# Patient Record
Sex: Male | Born: 1970 | Race: White | Hispanic: No | Marital: Single | Smoking: Never smoker
Health system: Southern US, Community
[De-identification: ages and names within clinical notes are randomized; demographics above are authoritative.]

## PROBLEM LIST (undated history)

## (undated) DIAGNOSIS — Z8619 Personal history of other infectious and parasitic diseases: Secondary | ICD-10-CM

## (undated) DIAGNOSIS — K219 Gastro-esophageal reflux disease without esophagitis: Secondary | ICD-10-CM

## (undated) DIAGNOSIS — F3181 Bipolar II disorder: Secondary | ICD-10-CM

## (undated) DIAGNOSIS — G43909 Migraine, unspecified, not intractable, without status migrainosus: Secondary | ICD-10-CM

## (undated) DIAGNOSIS — I499 Cardiac arrhythmia, unspecified: Secondary | ICD-10-CM

## (undated) DIAGNOSIS — F32A Depression, unspecified: Secondary | ICD-10-CM

## (undated) DIAGNOSIS — F419 Anxiety disorder, unspecified: Secondary | ICD-10-CM

## (undated) DIAGNOSIS — F329 Major depressive disorder, single episode, unspecified: Secondary | ICD-10-CM

## (undated) HISTORY — DX: Gastro-esophageal reflux disease without esophagitis: K21.9

## (undated) HISTORY — DX: Personal history of other infectious and parasitic diseases: Z86.19

## (undated) HISTORY — DX: Anxiety disorder, unspecified: F41.9

## (undated) HISTORY — DX: Migraine, unspecified, not intractable, without status migrainosus: G43.909

## (undated) HISTORY — DX: Major depressive disorder, single episode, unspecified: F32.9

## (undated) HISTORY — DX: Bipolar II disorder: F31.81

## (undated) HISTORY — DX: Depression, unspecified: F32.A

---

## 2014-10-14 DIAGNOSIS — Z9884 Bariatric surgery status: Secondary | ICD-10-CM | POA: Insufficient documentation

## 2014-10-14 HISTORY — PX: BARIATRIC SURGERY: SHX1103

## 2016-10-14 HISTORY — PX: SHOULDER ARTHROSCOPY W/ ROTATOR CUFF REPAIR: SHX2400

## 2018-08-01 ENCOUNTER — Ambulatory Visit: Payer: Self-pay | Admitting: Family Medicine

## 2018-11-10 DIAGNOSIS — G43909 Migraine, unspecified, not intractable, without status migrainosus: Secondary | ICD-10-CM

## 2018-11-10 HISTORY — DX: Migraine, unspecified, not intractable, without status migrainosus: G43.909

## 2018-11-18 ENCOUNTER — Emergency Department (HOSPITAL_COMMUNITY): Payer: No Typology Code available for payment source

## 2018-11-18 ENCOUNTER — Emergency Department (HOSPITAL_COMMUNITY)
Admission: EM | Admit: 2018-11-18 | Discharge: 2018-11-19 | Disposition: A | Discharge status: Home / Self Care | Payer: No Typology Code available for payment source | Attending: Emergency Medicine | Admitting: Emergency Medicine

## 2018-11-18 ENCOUNTER — Other Ambulatory Visit: Payer: Self-pay

## 2018-11-18 ENCOUNTER — Encounter (HOSPITAL_COMMUNITY): Payer: Self-pay

## 2018-11-18 DIAGNOSIS — Z79899 Other long term (current) drug therapy: Secondary | ICD-10-CM | POA: Diagnosis not present

## 2018-11-18 DIAGNOSIS — R42 Dizziness and giddiness: Secondary | ICD-10-CM | POA: Diagnosis not present

## 2018-11-18 DIAGNOSIS — Y939 Activity, unspecified: Secondary | ICD-10-CM | POA: Insufficient documentation

## 2018-11-18 DIAGNOSIS — Y999 Unspecified external cause status: Secondary | ICD-10-CM | POA: Insufficient documentation

## 2018-11-18 DIAGNOSIS — Y92410 Unspecified street and highway as the place of occurrence of the external cause: Secondary | ICD-10-CM | POA: Diagnosis not present

## 2018-11-18 DIAGNOSIS — S0990XA Unspecified injury of head, initial encounter: Secondary | ICD-10-CM
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
## 2018-11-19 MED ORDER — HYDROXYZINE HCL 25 MG PO TABS: 25.0000 mg | ORAL_TABLET | Freq: Four times a day (QID) | ORAL | 0 refills | Status: DC | PRN
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
## 2018-11-22 ENCOUNTER — Ambulatory Visit (INDEPENDENT_AMBULATORY_CARE_PROVIDER_SITE_OTHER): Payer: No Typology Code available for payment source | Admitting: Physician Assistant

## 2018-11-22 ENCOUNTER — Encounter: Payer: Self-pay | Admitting: Physician Assistant

## 2018-11-22 ENCOUNTER — Telehealth: Payer: Self-pay | Admitting: Physician Assistant

## 2018-11-22 VITALS — BP 130/84 | HR 86 | Temp 98.5°F | Ht 69.5 in | Wt 197.5 lb

## 2018-11-22 DIAGNOSIS — F321 Major depressive disorder, single episode, moderate: Secondary | ICD-10-CM | POA: Insufficient documentation

## 2018-11-22 DIAGNOSIS — B009 Herpesviral infection, unspecified: Secondary | ICD-10-CM | POA: Insufficient documentation

## 2018-11-22 DIAGNOSIS — F3181 Bipolar II disorder: Secondary | ICD-10-CM | POA: Insufficient documentation

## 2018-11-22 DIAGNOSIS — Z9884 Bariatric surgery status: Secondary | ICD-10-CM | POA: Insufficient documentation

## 2018-11-22 DIAGNOSIS — F419 Anxiety disorder, unspecified: Secondary | ICD-10-CM | POA: Diagnosis not present

## 2018-11-22 DIAGNOSIS — S060X0D Concussion without loss of consciousness, subsequent encounter: Secondary | ICD-10-CM

## 2018-11-22 MED ORDER — HYDROXYZINE HCL 50 MG PO TABS: 50.0000 mg | ORAL_TABLET | Freq: Three times a day (TID) | ORAL | 0 refills | Status: DC | PRN

## 2018-11-22 MED ORDER — ESOMEPRAZOLE MAGNESIUM 40 MG PO CPDR: 40.0000 mg | DELAYED_RELEASE_CAPSULE | Freq: Every day | ORAL | 1 refills | Status: DC

## 2018-11-22 MED ORDER — VALACYCLOVIR HCL 1 G PO TABS: ORAL_TABLET | ORAL | 2 refills | Status: DC

## 2018-11-22 NOTE — Patient Instructions (Signed)
It was great to meet you!  Here's the psychiatrist I recommend, if not in network, refer to the list. [facility] [address], [address] [place], [place] [address] Phone: [phone] Fax: [phone]  Please call the Concussion Clinic at [facility]: [phone]   Concussion, Adult A concussion is a brain injury from a direct hit (blow) to the head or body. This injury causes the brain to shake quickly back and forth inside the skull. It is caused by:  A hit to the head.  A quick and sudden movement (jolt) of the head or neck. How fast you will get better from a concussion depends on many things. Recovery can take time. It is important to wait to return to activity until a doctor says it is safe and your symptoms are all gone. Follow these instructions at home: Activity  Limit activities that need a lot of thought or concentration. You may need to talk with your work Production designer, theatre/television/film or teachers about this. Limit activities such as: ? Homework or work for your job. ? Watching TV. ? Computer work. ? Playing memory games and puzzles.  Rest. Rest helps the brain to heal. Make sure you: ? Get plenty of sleep at night. Do not stay up late. ? Rest during the day. Take naps or rest breaks when you feel tired.  Do not do activities that could cause a second concussion, such as riding a bike or playing sports. It can be dangerous if you get another concussion before the first one has healed.  Ask your doctor when you can return to your normal activities, like driving, riding a bike, or using machinery. Your ability to react may be slower. Do not do these activities if you are dizzy. Your doctor will likely give you a plan for slowly going back to activities. General instructions  Take over-the-counter and prescription medicines only as told by your doctor.  Do not drink alcohol until your doctor says you can.  Watch your symptoms and tell other people to do the same. Other  problems (complications) can happen after a concussion. Older adults with a brain injury may have a higher risk of serious problems, such as a blood clot in the brain.  Tell your work Production designer, theatre/television/film, teachers, Tax adviser, school counselor, coach, or Event organiser about your injury and symptoms. Tell them about what you can or cannot do. They should watch you for: ? More problems with attention or concentration. ? More trouble remembering or learning new information. ? More time needed to do tasks or assignments. ? Being more annoyed (irritable) or having a harder time dealing with stress. ? Any other symptoms that get worse.  Keep all follow-up visits as told by your doctor. This is important. Prevention  It is very important that you donot get another brain injury, especially before you have healed. In rare cases, another injury can cause permanent brain damage, brain swelling, or death. You have the most risk if you get another head injury in the first 7-10 days after you were hurt before. To avoid injuries: ? Avoid activities that could make you get a second concussion, like contact sports. ? When you have returned to sports or activities:  Avoid plays or moves that can cause you to crash into another person. This is how most concussions happen.  Follow the rules and be respectful of other players. ? Get regular exercise that includes strength and balance training. ? Wear a helmet when you do activities like:  Biking.  Skiing.  Skateboarding.  Skating. ? Helmets can help protect you from serious skull and brain injuries, but they do not protect your from a concussion. Even when wearing a helmet, you should avoid being hit in the head. Contact a doctor if:  Your symptoms get worse or they do not get better.  You have new symptoms.  You have another injury. Get help right away if:  You have bad headaches or your headaches get worse.  You have weakness in any part of your  body.  You are confused.  Your coordination gets worse.  You keep throwing up (vomiting).  You feel more sleepy than normal.  You twitch or shake violently (convulse) or have a seizure.  Your speech is not clear (is slurred).  You have strange behavior changes.  You have changes in how you see (vision).  You pass out (lose consciousness). Summary  A concussion is a brain injury from a direct hit (blow) to the head or body.  This condition is treated with rest and careful watching of symptoms.  If you keep having symptoms, call your doctor. This information is not intended to replace advice given to you by your health care provider. Make sure you discuss any questions you have with your health care provider. Document Released: 10/11/2009 Document Revised: 12/04/2017 Document Reviewed: 12/04/2017 Elsevier Interactive Patient Education  2019 [employer].
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
## 2018-11-22 NOTE — Telephone Encounter (Signed)
Spoke to pt, told him I saw he called with update on psychiatrist. Pt said yes, he is going back to [place] next week and seeing a different provider. Told pt okay good. Asked pt if he got in touch with the Concussion clinic? Pt said no but left message. Told pt if you do not hear from them first thing Monday AM to call them. Pt verbalized understanding. Asked pt why he scheduled an appt next week the 24th with Samantha? Pt said to discuss Bariatric labs. Told pt Lelon Mast said that she wanted to wait on doing a physical and labs until your other problems were under control, so you do not need to come in next week. Pt verbalized understanding and said okay can cancel appt. Pt Appt cancelled.

## 2018-11-25 ENCOUNTER — Telehealth: Payer: Self-pay

## 2018-11-25 ENCOUNTER — Telehealth: Payer: Self-pay | Admitting: Physician Assistant

## 2018-11-25 ENCOUNTER — Telehealth: Payer: Self-pay | Admitting: *Deleted

## 2018-11-25 MED ORDER — OMEPRAZOLE 20 MG PO CPDR: 20.0000 mg | DELAYED_RELEASE_CAPSULE | Freq: Every day | ORAL | 1 refills | Status: DC
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
## 2018-11-25 NOTE — Telephone Encounter (Signed)
Discussed with Lelon MastSamantha okay to change pt back to Prilosec 20 mg. Rx sent to pharmacy.   Called and spoke to pt told him Rx for Prilosec 20 mg was sent to pharmacy as requested. Pt verbalized understanding.
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
## 2018-11-25 NOTE — Telephone Encounter (Signed)
Left message for patient to call back  

## 2018-11-25 NOTE — Telephone Encounter (Signed)
See note

## 2018-11-25 NOTE — Telephone Encounter (Signed)
Received fax for Prior Authorizations needed for Nexium and Valtrex. PA started thru [employer], waiting on approval.

## 2018-11-25 NOTE — Telephone Encounter (Signed)
Patient is requesting change back to Prilosec- does not like Nexuim. Sent for PCP review.

## 2018-11-25 NOTE — Telephone Encounter (Signed)
Copied from CRM [id number]. Topic: Quick Communication - Rx Refill/Question >> Nov 25, 2018 12:39 PM Burchel, Abbi R wrote: Medication: esomeprazole (NEXIUM) 40 MG capsule  Pt states he was taking Prilosec 20MG  Tablets previously, and would like to continue taking this instead of Nexium.  Please advise.   Preferred Pharmacy: [facility] [address] [place] - [address]  [phone] (Phone) [phone] (Fax)   Pt was advised that RX refills may take up to 3 business days. We ask that you follow-up with your pharmacy.

## 2018-11-26 ENCOUNTER — Telehealth: Payer: Self-pay | Admitting: Physician Assistant

## 2018-11-26 ENCOUNTER — Ambulatory Visit (INDEPENDENT_AMBULATORY_CARE_PROVIDER_SITE_OTHER): Payer: No Typology Code available for payment source | Admitting: Family Medicine

## 2018-11-26 ENCOUNTER — Encounter: Payer: Self-pay | Admitting: Family Medicine

## 2018-11-26 DIAGNOSIS — G44321 Chronic post-traumatic headache, intractable: Secondary | ICD-10-CM

## 2018-11-26 DIAGNOSIS — G44309 Post-traumatic headache, unspecified, not intractable: Secondary | ICD-10-CM | POA: Insufficient documentation

## 2018-11-26 NOTE — Assessment & Plan Note (Signed)
Patient is having more of a posttraumatic headache.  I do not see any signs of any postconcussive symptoms.  I do believe that patient's underlying anxiety and depression has worsened recently.  Patient is going to be seen another psychiatrist that I think could be more beneficial.  We discussed different medications that I think will be helpful.  Discussed icing regimen and home exercise.  We discussed which activities to do.  Patient is to increase activity slowly as well.  Patient was put on some mild restrictions at work with 4-hour shifts for the next 2 weeks.  Discussed over-the-counter medications.  Follow-up again in 2-3 weeks
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
## 2018-11-26 NOTE — Telephone Encounter (Signed)
Please see message and advise 

## 2018-11-26 NOTE — Telephone Encounter (Signed)
PA cancelled due to pt no longer has Medicaid and already picked up the Rx's.

## 2018-11-26 NOTE — Telephone Encounter (Signed)
See note

## 2018-11-26 NOTE — Telephone Encounter (Signed)
Copied from CRM [id number]. Topic: General - Inquiry >> Nov 26, 2018  9:50 AM Lynne Logan D wrote: Reason for CRM: Pt stated that he discussed changing from Klonopin to Adderall due to Klonopin making symptoms worse. He would like to try the Adderall. Please advise pt. CB# [phone]

## 2018-11-26 NOTE — Telephone Encounter (Signed)
Spoke to pt told him Lelon Mast is not comfortable prescribing Adderall instead of Klonopin need to discuss with Psychiatrist. Pt verbalized understanding. Also asked pt if he had Medicaid insurance because I received PA for medication Nexium and Valtrex but I am unable to answer the questions they are asking do to not know your past history. I cancelled the one for Nexium since we changed to Prilosec. Pt said he used to have Medicaid two months ago but now has insurance and uses [facility] and has already gotten his Rx's told pt okay, please let the pharmacy no that you do not have Medicaid anymore so they remove it from the system. Pt verbalized understanding.

## 2018-11-26 NOTE — Telephone Encounter (Signed)
I am not comfortable with this -- patient needs to discuss with his psychiatrist.

## 2018-11-29 ENCOUNTER — Ambulatory Visit: Payer: No Typology Code available for payment source | Admitting: Physician Assistant
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
## 2018-12-10 ENCOUNTER — Encounter: Payer: Self-pay | Admitting: Family Medicine

## 2018-12-10 ENCOUNTER — Ambulatory Visit (INDEPENDENT_AMBULATORY_CARE_PROVIDER_SITE_OTHER): Payer: No Typology Code available for payment source | Admitting: Family Medicine

## 2018-12-10 DIAGNOSIS — G44321 Chronic post-traumatic headache, intractable: Secondary | ICD-10-CM

## 2018-12-10 NOTE — Assessment & Plan Note (Signed)
Posttraumatic headache with exacerbation of underlying anxiety.  Seeing a psychiatrist and they had to change medications.  Discussed continuing the over-the-counter medications in addition to this patient was sent to vestibular neuro training to help with some of the intermittent dizziness.  Continue over-the-counter medications.  Patient will have continued mild restriction over the course of next 2 weeks with his job.  Follow-up with me again in 2 to 3 weeks to hopefully be fully released

## 2018-12-10 NOTE — Patient Instructions (Signed)
Great to see you  PT will call you to help with the dizziness Continue the vitamins Still part time for another 2 weeks See me again in 2-3 weeks and you should be near you self
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
## 2018-12-24 ENCOUNTER — Encounter: Payer: Self-pay | Admitting: Family Medicine

## 2018-12-24 ENCOUNTER — Ambulatory Visit (INDEPENDENT_AMBULATORY_CARE_PROVIDER_SITE_OTHER): Payer: No Typology Code available for payment source | Admitting: Family Medicine

## 2018-12-24 DIAGNOSIS — G44321 Chronic post-traumatic headache, intractable: Secondary | ICD-10-CM

## 2018-12-24 NOTE — Patient Instructions (Signed)
Great to see you  Continue the vitamins until you are symptom free  Lets hold on PT for now but have tricks up our sleeves if needed I expect you perfect in 2 weeks but have an appointment set up in 3 weeks just in case So glad you are doing well
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
## 2019-01-10 ENCOUNTER — Ambulatory Visit (INDEPENDENT_AMBULATORY_CARE_PROVIDER_SITE_OTHER): Payer: No Typology Code available for payment source | Admitting: Family Medicine

## 2019-01-10 ENCOUNTER — Ambulatory Visit: Payer: Self-pay | Admitting: *Deleted

## 2019-01-10 ENCOUNTER — Encounter: Payer: Self-pay | Admitting: Family Medicine

## 2019-01-10 ENCOUNTER — Other Ambulatory Visit (HOSPITAL_COMMUNITY)
Admission: RE | Admit: 2019-01-10 | Discharge: 2019-01-10 | Disposition: A | Discharge status: Home / Self Care | Payer: No Typology Code available for payment source | Source: Ambulatory Visit | Attending: Family Medicine | Admitting: Family Medicine

## 2019-01-10 VITALS — BP 124/89 | HR 96 | Temp 97.6°F | Resp 12 | Ht 69.0 in | Wt 197.0 lb

## 2019-01-10 DIAGNOSIS — R300 Dysuria: Secondary | ICD-10-CM

## 2019-01-10 DIAGNOSIS — Z7252 High risk homosexual behavior: Secondary | ICD-10-CM | POA: Insufficient documentation

## 2019-01-10 LAB — POCT URINALYSIS DIPSTICK
Bilirubin, UA: NEGATIVE
Blood, UA: NEGATIVE
Glucose, UA: NEGATIVE
Ketones, UA: NEGATIVE
Leukocytes, UA: NEGATIVE
Nitrite, UA: NEGATIVE
PROTEIN UA: NEGATIVE
Spec Grav, UA: <=1.005 — AB (ref 1.010–1.025)
Urobilinogen, UA: 0.2 E.U./dL
pH, UA: 6 (ref 5.0–8.0)

## 2019-01-10 MED ORDER — CIPROFLOXACIN HCL 500 MG PO TABS: 500.0000 mg | ORAL_TABLET | Freq: Two times a day (BID) | ORAL | 0 refills | Status: DC

## 2019-01-10 MED FILL — CIPROFLOXACIN HCL 500 MG TA: 500 | 10 days supply | Qty: 20 | Fill #0

## 2019-01-10 NOTE — Telephone Encounter (Signed)
Patient called in with c/o "pain to bladder area." He says "for the past 3 weeks, I've had pain to the lower left side of my abdomen going into my bladder and on the tip of my penis. When I urinate, I don't feel like I'm emptying completely. I still have the urge to go, but nothing comes out. I have some burning at times when I urinate and the tip of my penis has pain, but no drainage. My pain is a 7 and it's getting worse. My urine is dark color, I have pain when I urinate and I don't have a good stream of urine, I have to press on my lower abdomen to make it all come out." I asked about flank pain, blood in urine, fever, he denies. According to protocol, see PCP within 24 hours, no availability with PCP or anyone in the practice. Offered patient to go to another [facility] location, he agrees, appointment scheduled for today at 1600 with Dr. Zola Button at [facility], care advice given, patient verbalized understanding.  Reason for Disposition . Urinating more frequently than usual (i.e., frequency)  Answer Assessment - Initial Assessment Questions 1. SYMPTOM: "What's the main symptom you're concerned about?" (e.g., frequency, incontinence)     Pain in abdomen and tip of penis 2. ONSET: "When did the symptoms start?"     3 weeks ago 3. PAIN: "Is there any pain?" If so, ask: "How bad is it?" (Scale: 1-10; mild, moderate, severe)     7 4. CAUSE: "What do you think is causing the symptoms?"     Feels like a kidney infection 5. OTHER SYMPTOMS: "Do you have any other symptoms?" (e.g., fever, flank pain, blood in urine, pain with urination)     Dark colored urine, pain with urination, not a good stream of urine 6. PREGNANCY: "Is there any chance you are pregnant?" "When was your last menstrual period?"     N/A  Protocols used: URINARY [facility]
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
## 2019-01-10 NOTE — Patient Instructions (Signed)

## 2019-01-11 DIAGNOSIS — Z7252 High risk homosexual behavior: Secondary | ICD-10-CM | POA: Insufficient documentation

## 2019-01-11 DIAGNOSIS — R300 Dysuria: Secondary | ICD-10-CM | POA: Insufficient documentation

## 2019-01-11 LAB — URINE CULTURE
MICRO NUMBER:: 287207
Result:: NO GROWTH
SPECIMEN QUALITY: ADEQUATE

## 2019-01-11 NOTE — Assessment & Plan Note (Signed)
Urine culture and ancillary pending cipro sent to pharmacy due to symptoms  F/u pcp

## 2019-01-11 NOTE — Assessment & Plan Note (Signed)
Sent off urine ancillary but unable to do HIV because lab is closed Will be able to do when he sees pcp next week

## 2019-01-13 LAB — URINE CYTOLOGY ANCILLARY ONLY
Chlamydia: NEGATIVE
Neisseria Gonorrhea: NEGATIVE
Trichomonas: NEGATIVE

## 2019-01-14 ENCOUNTER — Ambulatory Visit (INDEPENDENT_AMBULATORY_CARE_PROVIDER_SITE_OTHER): Payer: No Typology Code available for payment source | Admitting: Physician Assistant

## 2019-01-14 ENCOUNTER — Encounter: Payer: Self-pay | Admitting: Physician Assistant

## 2019-01-14 VITALS — BP 110/86 | HR 83 | Temp 97.8°F | Ht 69.0 in | Wt 196.5 lb

## 2019-01-14 DIAGNOSIS — F321 Major depressive disorder, single episode, moderate: Secondary | ICD-10-CM | POA: Diagnosis not present

## 2019-01-14 DIAGNOSIS — F419 Anxiety disorder, unspecified: Secondary | ICD-10-CM

## 2019-01-14 DIAGNOSIS — Z13220 Encounter for screening for lipoid disorders: Secondary | ICD-10-CM

## 2019-01-14 DIAGNOSIS — Z0000 Encounter for general adult medical examination without abnormal findings: Secondary | ICD-10-CM | POA: Diagnosis not present

## 2019-01-14 DIAGNOSIS — Z113 Encounter for screening for infections with a predominantly sexual mode of transmission: Secondary | ICD-10-CM

## 2019-01-14 DIAGNOSIS — Z136 Encounter for screening for cardiovascular disorders: Secondary | ICD-10-CM | POA: Diagnosis not present

## 2019-01-14 DIAGNOSIS — Z9884 Bariatric surgery status: Secondary | ICD-10-CM | POA: Diagnosis not present

## 2019-01-14 DIAGNOSIS — F3181 Bipolar II disorder: Secondary | ICD-10-CM

## 2019-01-14 LAB — CBC WITH DIFFERENTIAL/PLATELET
Basophils Absolute: 0 10*3/uL (ref 0.0–0.1)
Basophils Relative: 0.7 % (ref 0.0–3.0)
Eosinophils Absolute: 0 10*3/uL (ref 0.0–0.7)
Eosinophils Relative: 0 % (ref 0.0–5.0)
HCT: 45.1 % (ref 39.0–52.0)
Hemoglobin: 15.4 g/dL (ref 13.0–17.0)
Lymphocytes Relative: 46.1 % — ABNORMAL HIGH (ref 12.0–46.0)
Lymphs Abs: 2.2 10*3/uL (ref 0.7–4.0)
MCHC: 34.2 g/dL (ref 30.0–36.0)
MCV: 94.6 fl (ref 78.0–100.0)
Monocytes Absolute: 0.5 10*3/uL (ref 0.1–1.0)
Monocytes Relative: 10.2 % (ref 3.0–12.0)
Neutro Abs: 2 10*3/uL (ref 1.4–7.7)
Neutrophils Relative %: 43 % (ref 43.0–77.0)
Platelets: 295 10*3/uL (ref 150.0–400.0)
RBC: 4.77 Mil/uL (ref 4.22–5.81)
RDW: 12.8 % (ref 11.5–15.5)
WBC: 4.8 10*3/uL (ref 4.0–10.5)

## 2019-01-14 LAB — URINE CYTOLOGY ANCILLARY ONLY
Bacterial vaginitis: NEGATIVE
Candida vaginitis: NEGATIVE

## 2019-01-14 LAB — BASIC METABOLIC PANEL
BUN: 19 mg/dL (ref 6–23)
CHLORIDE: 98 meq/L (ref 96–112)
CO2: 29 mEq/L (ref 19–32)
Calcium: 9.7 mg/dL (ref 8.4–10.5)
Creatinine, Ser: 0.81 mg/dL (ref 0.40–1.50)
GFR: 102.74 mL/min (ref >60.00)
Glucose, Bld: 87 mg/dL (ref 70–99)
Potassium: 4 mEq/L (ref 3.5–5.1)
Sodium: 135 mEq/L (ref 135–145)

## 2019-01-14 LAB — HEPATIC FUNCTION PANEL
ALT: 26 U/L (ref 0–53)
AST: 19 U/L (ref 0–37)
Albumin: 4.7 g/dL (ref 3.5–5.2)
Alkaline Phosphatase: 69 U/L (ref 39–117)
Bilirubin, Direct: 0 mg/dL (ref 0.0–0.3)
TOTAL PROTEIN: 6.9 g/dL (ref 6.0–8.3)
Total Bilirubin: 0.4 mg/dL (ref 0.2–1.2)

## 2019-01-14 LAB — LDL CHOLESTEROL, DIRECT: Direct LDL: 153 mg/dL

## 2019-01-14 LAB — LIPID PANEL
CHOLESTEROL: 243 mg/dL — AB (ref 0–200)
HDL: 64.7 mg/dL (ref >39.00)
NonHDL: 178.2
TRIGLYCERIDES: 285 mg/dL — AB (ref 0.0–149.0)
Total CHOL/HDL Ratio: 4
VLDL: 57 mg/dL — ABNORMAL HIGH (ref 0.0–40.0)

## 2019-01-14 LAB — VITAMIN D 25 HYDROXY (VIT D DEFICIENCY, FRACTURES): VITD: 38.32 ng/mL (ref 30.00–100.00)

## 2019-01-14 LAB — VITAMIN B12: Vitamin B-12: 1481 pg/mL — ABNORMAL HIGH (ref 211–911)
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
## 2019-01-14 NOTE — Patient Instructions (Signed)
It was great to see you!  Please go to the lab for blood work.   Our office will call you with your results unless you have chosen to receive results via MyChart.  If your blood work is normal we will follow-up each year for physicals and as scheduled for chronic medical problems.  If anything is abnormal we will treat accordingly and get you in for a follow-up.  Take care,  Mario Cameron    Health Maintenance, Male A healthy lifestyle and preventive care is important for your health and wellness. Ask your health care provider about what schedule of regular examinations is right for you. What should I know about weight and diet? Eat a Healthy Diet  Eat plenty of vegetables, fruits, whole grains, low-fat dairy products, and lean protein.  Do not eat a lot of foods high in solid fats, added sugars, or salt.  Maintain a Healthy Weight Regular exercise can help you achieve or maintain a healthy weight. You should:  Do at least 150 minutes of exercise each week. The exercise should increase your heart rate and make you sweat (moderate-intensity exercise).  Do strength-training exercises at least twice a week. Watch Your Levels of Cholesterol and Blood Lipids  Have your blood tested for lipids and cholesterol every 5 years starting at 46 years of age. If you are at high risk for heart disease, you should start having your blood tested when you are 46 years old. You may need to have your cholesterol levels checked more often if: ? Your lipid or cholesterol levels are high. ? You are older than 46 years of age. ? You are at high risk for heart disease. What should I know about cancer screening? Many types of cancers can be detected early and may often be prevented. Lung Cancer  You should be screened every year for lung cancer if: ? You are a current smoker who has smoked for at least 30 years. ? You are a former smoker who has quit within the past 15 years.  Talk to your health care  provider about your screening options, when you should start screening, and how often you should be screened. Colorectal Cancer  Routine colorectal cancer screening usually begins at 46 years of age and should be repeated every 5-10 years until you are 46 years old. You may need to be screened more often if early forms of precancerous polyps or small growths are found. Your health care provider may recommend screening at an earlier age if you have risk factors for colon cancer.  Your health care provider may recommend using home test kits to check for hidden blood in the stool.  A small camera at the end of a tube can be used to examine your colon (sigmoidoscopy or colonoscopy). This checks for the earliest forms of colorectal cancer. Prostate and Testicular Cancer  Depending on your age and overall health, your health care provider may do certain tests to screen for prostate and testicular cancer.  Talk to your health care provider about any symptoms or concerns you have about testicular or prostate cancer. Skin Cancer  Check your skin from head to toe regularly.  Tell your health care provider about any new moles or changes in moles, especially if: ? There is a change in a mole's size, shape, or color. ? You have a mole that is larger than a pencil eraser.  Always use sunscreen. Apply sunscreen liberally and repeat throughout the day.  Protect yourself by wearing   long sleeves, pants, a wide-brimmed hat, and sunglasses when outside. What should I know about heart disease, diabetes, and high blood pressure?  If you are 18-39 years of age, have your blood pressure checked every 3-5 years. If you are 40 years of age or older, have your blood pressure checked every year. You should have your blood pressure measured twice-once when you are at a hospital or clinic, and once when you are not at a hospital or clinic. Record the average of the two measurements. To check your blood pressure when you  are not at a hospital or clinic, you can use: ? An automated blood pressure machine at a pharmacy. ? A home blood pressure monitor.  Talk to your health care provider about your target blood pressure.  If you are between 45-79 years old, ask your health care provider if you should take aspirin to prevent heart disease.  Have regular diabetes screenings by checking your fasting blood sugar level. ? If you are at a normal weight and have a low risk for diabetes, have this test once every three years after the age of 45. ? If you are overweight and have a high risk for diabetes, consider being tested at a younger age or more often.  A one-time screening for abdominal aortic aneurysm (AAA) by ultrasound is recommended for men aged 65-75 years who are current or former smokers. What should I know about preventing infection? Hepatitis B If you have a higher risk for hepatitis B, you should be screened for this virus. Talk with your health care provider to find out if you are at risk for hepatitis B infection. Hepatitis C Blood testing is recommended for:  Everyone born from 1945 through 1965.  Anyone with known risk factors for hepatitis C. Sexually Transmitted Diseases (STDs)  You should be screened each year for STDs including gonorrhea and chlamydia if: ? You are sexually active and are younger than 46 years of age. ? You are older than 46 years of age and your health care provider tells you that you are at risk for this type of infection. ? Your sexual activity has changed since you were last screened and you are at an increased risk for chlamydia or gonorrhea. Ask your health care provider if you are at risk.  Talk with your health care provider about whether you are at high risk of being infected with HIV. Your health care provider may recommend a prescription medicine to help prevent HIV infection. What else can I do?  Schedule regular health, dental, and eye exams.  Stay current  with your vaccines (immunizations).  Do not use any tobacco products, such as cigarettes, chewing tobacco, and e-cigarettes. If you need help quitting, ask your health care provider.  Limit alcohol intake to no more than 2 drinks per day. One drink equals 12 ounces of beer, 5 ounces of wine, or 1 ounces of hard liquor.  Do not use street drugs.  Do not share needles.  Ask your health care provider for help if you need support or information about quitting drugs.  Tell your health care provider if you often feel depressed.  Tell your health care provider if you have ever been abused or do not feel safe at home. This information is not intended to replace advice given to you by your health care provider. Make sure you discuss any questions you have with your health care provider. Document Released: 04/20/2008 Document Revised: 06/21/2016 Document Reviewed: 07/27/2015 Elsevier Interactive   Patient Education  2019 [employer]  

## 2019-01-15 ENCOUNTER — Encounter: Payer: Self-pay | Admitting: *Deleted

## 2019-01-15 DIAGNOSIS — F603 Borderline personality disorder: Secondary | ICD-10-CM | POA: Insufficient documentation

## 2019-01-17 ENCOUNTER — Ambulatory Visit: Payer: No Typology Code available for payment source | Admitting: Family Medicine

## 2019-01-22 ENCOUNTER — Telehealth: Payer: Self-pay | Admitting: Physician Assistant

## 2019-01-22 NOTE — Telephone Encounter (Signed)
See note  Copied from CRM [id number]. Topic: General - Inquiry >> Jan 22, 2019 10:14 AM Lynne Logan D wrote: Reason for CRM: Mario Cameron with [facility] stated that they need to know the source of the frozen specimen. Please advise. [id number] Ext [phone]

## 2019-01-22 NOTE — Telephone Encounter (Signed)
Spoke to Mario Cameron, told her it was a blood draw. Mario Cameron said she needs to know if it was plasma from another tube or separate tube. Told her I am not sure will pass call to the person who drew his blood and have her get back to you. Mario Cameron verbalized understanding.

## 2019-01-22 NOTE — Telephone Encounter (Signed)
Mario Cameron, please call Jamesetta So, see message.

## 2019-01-24 LAB — ZINC: Zinc: 97 ug/dL (ref 60–130)

## 2019-01-24 LAB — VITAMIN B1: Vitamin B1 (Thiamine): 9 nmol/L (ref 8–30)

## 2019-01-24 LAB — IRON,TIBC AND FERRITIN PANEL
%SAT: 22 % (calc) (ref 20–48)
Ferritin: 65 ng/mL (ref 38–380)
Iron: 88 ug/dL (ref 50–180)
TIBC: 397 ug/dL (ref 250–425)

## 2019-01-24 LAB — PTH, INTACT AND CALCIUM: CALCIUM: 9.8 mg/dL (ref 8.6–10.3)

## 2019-01-24 LAB — FOLATE RBC: RBC FOLATE: 632 ng/mL (ref >280)

## 2019-01-24 LAB — TIQ-NTM

## 2019-01-24 LAB — HIV ANTIBODY (ROUTINE TESTING W REFLEX): HIV 1&2 Ab, 4th Generation: NONREACTIVE

## 2019-01-24 LAB — RPR: RPR Ser Ql: NONREACTIVE

## 2019-01-24 LAB — CERULOPLASMIN: Ceruloplasmin: 25 mg/dL (ref 18–36)

## 2019-01-26 ENCOUNTER — Encounter: Payer: Self-pay | Admitting: Physician Assistant

## 2019-01-27 ENCOUNTER — Other Ambulatory Visit: Payer: Self-pay | Admitting: Physician Assistant

## 2019-01-27 MED ORDER — ATORVASTATIN CALCIUM 10 MG PO TABS: 10.0000 mg | ORAL_TABLET | Freq: Every day | ORAL | 1 refills | Status: DC

## 2019-01-28 LAB — PTH, INTACT AND CALCIUM
Calcium: 9.8 mg/dL (ref 8.6–10.3)
PTH: 29 pg/mL (ref 14–64)

## 2019-02-10 ENCOUNTER — Encounter: Payer: Self-pay | Admitting: Physician Assistant

## 2019-02-10 ENCOUNTER — Ambulatory Visit (INDEPENDENT_AMBULATORY_CARE_PROVIDER_SITE_OTHER): Payer: No Typology Code available for payment source | Admitting: Physician Assistant

## 2019-02-10 ENCOUNTER — Other Ambulatory Visit: Payer: Self-pay

## 2019-02-10 ENCOUNTER — Ambulatory Visit: Payer: Self-pay

## 2019-02-10 VITALS — BP 136/90 | HR 96

## 2019-02-10 DIAGNOSIS — M79661 Pain in right lower leg: Secondary | ICD-10-CM

## 2019-02-10 DIAGNOSIS — M791 Myalgia, unspecified site: Secondary | ICD-10-CM | POA: Diagnosis not present

## 2019-02-10 LAB — D-DIMER, QUANTITATIVE: D-Dimer, Quant: 0.43 mcg/mL FEU (ref <0.50)

## 2019-02-10 NOTE — Telephone Encounter (Signed)
Pt reporting an area on his right calf that looks like a "knot" or " 3 veins are swollen." There is  swelling to right ankle down to his instep on his right  foot. No SOB or difficulty breathing. No pain at the lump site, on his calf. No calf pain with walking, plantar or dorsi flexion. Pt stated no pain with ambulation. No pain or edema to the left leg. Pt informed to call 911 for any difficulty breathing, chest pain or  SOB.  Email verified.  Routing high priority to practice.      .Reason for Disposition . [1] Thigh, calf, or ankle swelling AND [2] only 1 side  Answer Assessment - Initial Assessment Questions 1. ONSET: "When did the swelling start?" (e.g., minutes, hours, days)     2 weeks ago just a know right calf no redness or warmth 2. LOCATION: "What part of the leg is swollen?"  "Are both legs swollen or just one leg?"     Above right ankle to to instep of the foot 3. SEVERITY: "How bad is the swelling?" (e.g., localized; mild, moderate, severe)  - Localized - small area of swelling localized to one leg  - MILD pedal edema - swelling limited to foot and ankle, pitting edema < 1/4 inch (6 mm) deep, rest and elevation eliminate most or all swelling  - MODERATE edema - swelling of lower leg to knee, pitting edema > 1/4 inch (6 mm) deep, rest and elevation only partially reduce swelling  - SEVERE edema - swelling extends above knee, facial or hand swelling present      mild 4. REDNESS: "Does the swelling look red or infected?"     no 5. PAIN: "Is the swelling painful to touch?" If so, ask: "How painful is it?"   (Scale 1-10; mild, moderate or severe)     No  6. FEVER: "Do you have a fever?" If so, ask: "What is it, how was it measured, and when did it start?"      no 7. CAUSE: "What do you think is causing the leg swelling?"     Blood clots or looks like 3 veins are puffy 8. MEDICAL HISTORY: "Do you have a history of heart failure, kidney disease, liver failure, or cancer?"      no 9. RECURRENT SYMPTOM: "Have you had leg swelling before?" If so, ask: "When was the last time?" "What happened that time?"     no 10. OTHER SYMPTOMS: "Do you have any other symptoms?" (e.g., chest pain, difficulty breathing)       no 11. PREGNANCY: "Is there any chance you are pregnant?" "When was your last menstrual period?"       n/a  Protocols used: LEG SWELLING AND EDEMA-A-AH

## 2019-02-10 NOTE — Patient Instructions (Signed)
It was great to chat with you today!  Please hold your statin (the lipitor) for now.  We will be in touch as soon as your labs return.  Get help right away if:  You have new or increased pain, swelling, or redness in an arm or leg.  You have numbness or tingling in an arm or leg.  You have shortness of breath while active or at rest.  You have chest pain.  You have a rapid or irregular heartbeat.  You feel light-headed or dizzy.  You cough up blood.  You notice blood in your vomit, bowel movement, or urine. These symptoms may represent a serious problem that is an emergency. Do not wait to see if the symptoms will go away. Get medical help right away. Call your local emergency services (911 in the [place] Do not drive yourself to the hospital.

## 2019-02-10 NOTE — Progress Notes (Signed)
Virtual Visit via Video   I connected with Mario Cameron on 02/10/19 at  2:40 PM EDT by a video enabled telemedicine application and verified that I am speaking with the correct person using two identifiers. Location patient: Home Location provider: [employer], Office Persons participating in the virtual visit: Mario Cameron, Mario Cameron, Mario Cameron , Mario Cameron, [place]  I discussed the limitations of evaluation and management by telemedicine and the availability of in person appointments. The patient expressed understanding and agreed to proceed.  Subjective:  I acted as a Neurosurgeon for [employer], PA-C [employer], LPN  HPI:   Knot right calf Patient reports an area on his right calf that looks like a "knot" or "3 veins are swollen." He first noticed this after going for a walk 2 weeks ago. This past Saturday, after coming home from work, he noticed that the area from his right ankle to the top of foot was swollen. He iced the area and states that his swelling has improved, however pain was so significant today that he left work.  Denies pain with ambulation. Pt denies chest pain, SOB, palpitations, hx of blood clot, recent immobilization, numbness/tingling to RLE.  He did recently start Lipitor 10 mg on 01/27/19.   ROS: See pertinent positives and negatives per HPI.  Patient Active Problem List   Diagnosis Date Noted  . 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 Dysuria 01/11/2019  . 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 Depression, major, single episode, moderate (HCC) 11/22/2018  . Anxiety 11/22/2018  . Bipolar 2 disorder (HCC) 11/22/2018  . 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 Smoking status: Never Smoker  . Smokeless tobacco: Never Used  Substance Use Topics  . Alcohol use: Not Currently    Current Outpatient Medications:  .  amitriptyline (ELAVIL) 25 MG tablet, Take  50 mg by mouth at bedtime. , Disp: , Rfl:  .  atorvastatin (LIPITOR) 10 MG tablet, Take 1 tablet (10 mg total) by mouth daily., Disp: 90 tablet, Rfl: 1 .  B Complex Vitamins (B COMPLEX PO), Take 1 tablet by mouth daily. 10000 IU, Disp: , Rfl:  .  CALCIUM PO, Take 1 tablet by mouth daily. , Disp: , Rfl:  .  Coenzyme Q10 (CO Q10 PO), Take by mouth., Disp: , Rfl:  .  IRON PO, Take 65 mg by mouth daily. , Disp: , Rfl:  .  lamoTRIgine (LAMICTAL) 200 MG tablet, Take 200-400 mg by mouth 2 (two) times daily. 400 mg in the am and 200 mg at bedtime, Disp: , Rfl:  .  Misc Natural Products (TART CHERRY ADVANCED PO), Take by mouth., Disp: , Rfl:  .  omeprazole (PRILOSEC) 20 MG capsule, Take 1 capsule (20 mg total) by mouth daily., Disp: 90 capsule, Rfl: 1 .  OVER THE COUNTER MEDICATION, Take 1,000 mg by mouth daily. Prostate supplement , Disp: , Rfl:  .  Potassium 75 MG TABS, Take by mouth., Disp: , Rfl:  .  tretinoin (RETIN-A) 0.1 % cream, Apply 1 application topically at bedtime., Disp: , Rfl:  .  Turmeric 500 MG CAPS, Take 1 capsule by mouth daily., Disp: , Rfl:  .  valACYclovir (VALTREX) 1000 MG tablet, Take two tablets ( total 2000 mg) by mouth q12h x 1 day; Start: ASAP after symptom onset (Patient taking differently: 250 mg. Take two tablets ( total 2000 mg) by mouth  q12h x 1 day; Start: ASAP after symptom onset), Disp: 6 tablet, Rfl: 2 .  venlafaxine XR (EFFEXOR-XR) 150 MG 24 hr capsule, Take 300 mg by mouth daily with breakfast., Disp: , Rfl:  .  VITAMIN D PO, Take 5,000 tablets by mouth daily. Pt takes 10,000 IU daily, Disp: , Rfl:  .  zolpidem (AMBIEN) 5 MG tablet, Take 10 mg by mouth at bedtime as needed for sleep. , Disp: , Rfl:   Allergies  Allergen Reactions  . Ibuprofen   . Nsaids     bleeding  . Tylenol [Acetaminophen]     kidney  . Wool Alcohol [Lanolin]     sherpa wool    Objective:   Vitals:   02/10/19 1535  BP: 136/90  Pulse: 96  SpO2: 98%    VITALS: Per patient if  applicable, see vitals. GENERAL: Alert, appears well and in no acute distress. HEENT: Atraumatic, conjunctiva clear, no obvious abnormalities on inspection of external nose and ears. NECK: Normal movements of the head and neck. CARDIOPULMONARY: No increased WOB. Speaking in clear sentences. I:E ratio WNL.  MS: Moves all visible extremities without noticeable abnormality. Does have small area of "bulging skin" to lateral RLE. Endorses palpable tenderness to R calf. No erythema present. PSYCH: Pleasant and cooperative, well-groomed. Speech normal rate and rhythm. Affect is appropriate. Insight and judgement are appropriate. Attention is focused, linear, and appropriate.  NEURO: CN grossly intact. Oriented as arrived to appointment on time with no prompting. Moves both UE equally.  SKIN: No obvious lesions, wounds, erythema, or cyanosis noted on face or hands.  Assessment and Plan:   [name] was seen today for pain.  Diagnoses and all orders for this visit:  Muscle pain -     CK  Right calf pain -     D-Dimer, Quantitative   Will order stat D-Dimer to r/o blood clot. Hold statin. Also check CK. If labs normal, consider virtual appointment with Berline Chough to discuss. ER precautions advised and provided in AVS.   . Reviewed expectations re: course of current medical issues. . Discussed self-management of symptoms. . Outlined signs and symptoms indicating need for more acute intervention. . Patient verbalized understanding and all questions were answered. Marland Kitchen Health Maintenance issues including appropriate healthy diet, exercise, and smoking avoidance were discussed with patient. . See orders for this visit as documented in the electronic medical record.  I discussed the assessment and treatment plan with the patient. The patient was provided an opportunity to ask questions and all were answered. The patient agreed with the plan and demonstrated an understanding of the instructions.   The  patient was advised to call back or seek an in-person evaluation if the symptoms worsen or if the condition fails to improve as anticipated.  CMA or LPN served as scribe during this visit. History, Physical, and Plan performed by medical provider. The above documentation has been reviewed and is accurate and complete.   [employer], Mario Cameron 02/10/2019

## 2019-02-10 NOTE — Telephone Encounter (Signed)
See triage note.

## 2019-02-11 LAB — CK: Total CK: 109 U/L (ref 7–232)

## 2019-04-16 ENCOUNTER — Ambulatory Visit: Payer: Self-pay | Admitting: *Deleted

## 2019-04-16 NOTE — Telephone Encounter (Addendum)
Panic attack 3 days ago. After attack resolved, noticed his heart rate slowing. Has noticed this several times.He denies any symptoms at this time. Asked him to check his heart rate he stated he does not have a machine, I proceeded to instruct him on checking his pulse rate, he refused then the call dropped or he disconnected.  Attempted to reach out to patient. No answer, left VM to return call if he needs an appointment.

## 2019-04-17 ENCOUNTER — Ambulatory Visit: Payer: Self-pay | Admitting: Physician Assistant

## 2019-04-17 ENCOUNTER — Encounter (HOSPITAL_COMMUNITY): Payer: Self-pay

## 2019-04-17 ENCOUNTER — Inpatient Hospital Stay (HOSPITAL_COMMUNITY)
Admission: EM | Admit: 2019-04-17 | Discharge: 2019-04-18 | DRG: 309 | Disposition: A | Discharge status: Home / Self Care | Payer: No Typology Code available for payment source | Attending: Internal Medicine | Admitting: Internal Medicine

## 2019-04-17 ENCOUNTER — Emergency Department (HOSPITAL_COMMUNITY): Payer: No Typology Code available for payment source

## 2019-04-17 ENCOUNTER — Ambulatory Visit (INDEPENDENT_AMBULATORY_CARE_PROVIDER_SITE_OTHER): Payer: No Typology Code available for payment source | Admitting: Family Medicine

## 2019-04-17 ENCOUNTER — Encounter: Payer: Self-pay | Admitting: Family Medicine

## 2019-04-17 ENCOUNTER — Other Ambulatory Visit: Payer: Self-pay

## 2019-04-17 VITALS — BP 115/79 | HR 144 | Temp 97.5°F | Ht 69.0 in | Wt 215.0 lb

## 2019-04-17 DIAGNOSIS — Z9884 Bariatric surgery status: Secondary | ICD-10-CM

## 2019-04-17 DIAGNOSIS — R000 Tachycardia, unspecified: Secondary | ICD-10-CM | POA: Diagnosis not present

## 2019-04-17 DIAGNOSIS — I4891 Unspecified atrial fibrillation: Secondary | ICD-10-CM | POA: Diagnosis present

## 2019-04-17 DIAGNOSIS — F321 Major depressive disorder, single episode, moderate: Secondary | ICD-10-CM | POA: Diagnosis not present

## 2019-04-17 DIAGNOSIS — F419 Anxiety disorder, unspecified: Secondary | ICD-10-CM | POA: Diagnosis present

## 2019-04-17 DIAGNOSIS — I4892 Unspecified atrial flutter: Secondary | ICD-10-CM | POA: Diagnosis not present

## 2019-04-17 DIAGNOSIS — R002 Palpitations: Secondary | ICD-10-CM

## 2019-04-17 DIAGNOSIS — F3181 Bipolar II disorder: Secondary | ICD-10-CM | POA: Diagnosis present

## 2019-04-17 DIAGNOSIS — K219 Gastro-esophageal reflux disease without esophagitis: Secondary | ICD-10-CM | POA: Diagnosis present

## 2019-04-17 DIAGNOSIS — Z1159 Encounter for screening for other viral diseases: Secondary | ICD-10-CM | POA: Diagnosis not present

## 2019-04-17 DIAGNOSIS — Z79899 Other long term (current) drug therapy: Secondary | ICD-10-CM | POA: Diagnosis not present

## 2019-04-17 LAB — CBC WITH DIFFERENTIAL/PLATELET
Abs Immature Granulocytes: 0.01 10*3/uL (ref 0.00–0.07)
Basophils Absolute: 0 10*3/uL (ref 0.0–0.1)
Basophils Relative: 1 %
Eosinophils Absolute: 0.1 10*3/uL (ref 0.0–0.5)
Eosinophils Relative: 2 %
HCT: 42.3 % (ref 39.0–52.0)
Hemoglobin: 13.7 g/dL (ref 13.0–17.0)
Immature Granulocytes: 0 %
Lymphocytes Relative: 36 %
Lymphs Abs: 1.8 10*3/uL (ref 0.7–4.0)
MCH: 31.5 pg (ref 26.0–34.0)
MCHC: 32.4 g/dL (ref 30.0–36.0)
MCV: 97.2 fL (ref 80.0–100.0)
Monocytes Absolute: 0.4 10*3/uL (ref 0.1–1.0)
Monocytes Relative: 7 %
Neutro Abs: 2.8 10*3/uL (ref 1.7–7.7)
Neutrophils Relative %: 54 %
Platelets: 240 10*3/uL (ref 150–400)
RBC: 4.35 MIL/uL (ref 4.22–5.81)
RDW: 13.2 % (ref 11.5–15.5)
WBC: 5.1 10*3/uL (ref 4.0–10.5)
nRBC: 0 % (ref 0.0–0.2)

## 2019-04-17 LAB — COMPREHENSIVE METABOLIC PANEL
ALT: 15 U/L (ref 0–44)
AST: 16 U/L (ref 15–41)
Albumin: 4.5 g/dL (ref 3.5–5.0)
Alkaline Phosphatase: 66 U/L (ref 38–126)
Anion gap: 9 (ref 5–15)
BUN: 15 mg/dL (ref 6–20)
CO2: 23 mmol/L (ref 22–32)
Calcium: 9 mg/dL (ref 8.9–10.3)
Chloride: 106 mmol/L (ref 98–111)
Creatinine, Ser: 0.9 mg/dL (ref 0.61–1.24)
GFR calc Af Amer: >60 mL/min (ref >60)
GFR calc non Af Amer: >60 mL/min (ref >60)
Glucose, Bld: 131 mg/dL — ABNORMAL HIGH (ref 70–99)
Potassium: 3.6 mmol/L (ref 3.5–5.1)
Sodium: 138 mmol/L (ref 135–145)
Total Bilirubin: 0.6 mg/dL (ref 0.3–1.2)
Total Protein: 7.4 g/dL (ref 6.5–8.1)

## 2019-04-17 LAB — I-STAT CHEM 8, ED
BUN: 14 mg/dL (ref 6–20)
Calcium, Ion: 1.18 mmol/L (ref 1.15–1.40)
Chloride: 106 mmol/L (ref 98–111)
Creatinine, Ser: 0.9 mg/dL (ref 0.61–1.24)
Glucose, Bld: 129 mg/dL — ABNORMAL HIGH (ref 70–99)
HCT: 41 % (ref 39.0–52.0)
Hemoglobin: 13.9 g/dL (ref 13.0–17.0)
Potassium: 3.7 mmol/L (ref 3.5–5.1)
Sodium: 138 mmol/L (ref 135–145)
TCO2: 22 mmol/L (ref 22–32)

## 2019-04-17 LAB — MRSA PCR SCREENING: MRSA by PCR: NEGATIVE

## 2019-04-17 LAB — TSH
TSH: 1.112 u[IU]/mL (ref 0.350–4.500)
TSH: 1.007 u[IU]/mL (ref 0.350–4.500)

## 2019-04-17 LAB — I-STAT TROPONIN, ED: Troponin i, poc: 0 ng/mL (ref 0.00–0.08)

## 2019-04-17 LAB — SARS CORONAVIRUS 2 BY RT PCR (HOSPITAL ORDER, PERFORMED IN ~~LOC~~ HOSPITAL LAB): SARS Coronavirus 2: NEGATIVE

## 2019-04-17 MED ORDER — ACETAMINOPHEN 650 MG RE SUPP: 650.0000 mg | Freq: Four times a day (QID) | RECTAL | Status: DC | PRN

## 2019-04-17 MED ORDER — DILTIAZEM LOAD VIA INFUSION
10.0000 mg | Freq: Once | INTRAVENOUS | Status: AC
  Administered 2019-04-17: 10 mg via INTRAVENOUS
  Filled 2019-04-17: qty 10

## 2019-04-17 MED ORDER — ADENOSINE 6 MG/2ML IV SOLN
6.0000 mg | Freq: Once | INTRAVENOUS | Status: AC
  Administered 2019-04-17: 6 mg via INTRAVENOUS
  Filled 2019-04-17: qty 2

## 2019-04-17 MED ORDER — ACETAMINOPHEN 325 MG PO TABS: 650.0000 mg | ORAL_TABLET | Freq: Four times a day (QID) | ORAL | Status: DC | PRN

## 2019-04-17 MED ORDER — SODIUM CHLORIDE 0.9 % IV BOLUS
1000.0000 mL | Freq: Once | INTRAVENOUS | Status: AC
  Administered 2019-04-17: 1000 mL via INTRAVENOUS

## 2019-04-17 MED ORDER — VENLAFAXINE HCL ER 75 MG PO CP24
300.0000 mg | ORAL_CAPSULE | Freq: Every day | ORAL | Status: DC
  Administered 2019-04-18: 300 mg via ORAL
  Filled 2019-04-17: qty 4

## 2019-04-17 MED ORDER — LAMOTRIGINE 100 MG PO TABS
100.0000 mg | ORAL_TABLET | Freq: Every day | ORAL | Status: DC
  Administered 2019-04-18: 100 mg via ORAL
  Filled 2019-04-17: qty 1

## 2019-04-17 MED ORDER — ONDANSETRON HCL 4 MG PO TABS: 4.0000 mg | ORAL_TABLET | Freq: Four times a day (QID) | ORAL | Status: DC | PRN

## 2019-04-17 MED ORDER — BUSPIRONE HCL 10 MG PO TABS
20.0000 mg | ORAL_TABLET | Freq: Every evening | ORAL | Status: DC | PRN
  Administered 2019-04-18: 20 mg via ORAL
  Filled 2019-04-17: qty 2

## 2019-04-17 MED ORDER — DILTIAZEM HCL 25 MG/5ML IV SOLN: 10.0000 mg | Freq: Once | INTRAVENOUS | Status: DC

## 2019-04-17 MED ORDER — APIXABAN 5 MG PO TABS
5.0000 mg | ORAL_TABLET | Freq: Two times a day (BID) | ORAL | Status: DC
  Administered 2019-04-17 – 2019-04-18 (×3): 5 mg via ORAL
  Filled 2019-04-17 (×3): qty 1

## 2019-04-17 MED ORDER — AMITRIPTYLINE HCL 25 MG PO TABS
50.0000 mg | ORAL_TABLET | Freq: Every day | ORAL | Status: DC
  Administered 2019-04-17: 50 mg via ORAL
  Filled 2019-04-17: qty 2

## 2019-04-17 MED ORDER — ZOLPIDEM TARTRATE 10 MG PO TABS
10.0000 mg | ORAL_TABLET | Freq: Every evening | ORAL | Status: DC | PRN
  Administered 2019-04-17: 10 mg via ORAL
  Filled 2019-04-17: qty 1

## 2019-04-17 MED ORDER — ADENOSINE 6 MG/2ML IV SOLN
INTRAVENOUS | Status: AC
  Filled 2019-04-17: qty 2

## 2019-04-17 MED ORDER — PANTOPRAZOLE SODIUM 40 MG PO TBEC
40.0000 mg | DELAYED_RELEASE_TABLET | Freq: Every day | ORAL | Status: DC
  Administered 2019-04-17 – 2019-04-18 (×2): 40 mg via ORAL
  Filled 2019-04-17 (×2): qty 1

## 2019-04-17 MED ORDER — ADENOSINE 6 MG/2ML IV SOLN
INTRAVENOUS | Status: AC
  Filled 2019-04-17: qty 4

## 2019-04-17 MED ORDER — ATORVASTATIN CALCIUM 10 MG PO TABS: 10.0000 mg | ORAL_TABLET | Freq: Every day | ORAL | Status: DC

## 2019-04-17 MED ORDER — ONDANSETRON HCL 4 MG/2ML IJ SOLN: 4.0000 mg | Freq: Four times a day (QID) | INTRAMUSCULAR | Status: DC | PRN

## 2019-04-17 MED ORDER — DILTIAZEM HCL 100 MG IV SOLR
5.0000 mg/h | INTRAVENOUS | Status: DC
  Administered 2019-04-17 – 2019-04-18 (×2): 5 mg/h via INTRAVENOUS
  Filled 2019-04-17 (×2): qty 100
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
## 2019-04-17 NOTE — ED Triage Notes (Addendum)
Patient arrived via POV.   Patient saw PCP today for Palpitations and told to come to ED.    HR-144  Patient denies pain, shob or chest pain.    Patient started experiencing palpations Sunday. Something triggered his palpitations patient states.  Patient states he has had rapid HR in past but this is a little different.    A/ox4 Ambulatory in ED.  Able to speak in complete sentences.

## 2019-04-17 NOTE — Patient Instructions (Signed)
It was very nice to see you today!  Please go to the ED to get treatment for your fast hear rate.  Take care, Dr Jerline Pain

## 2019-04-17 NOTE — Progress Notes (Signed)
Pt requested that his car keys (lime green in color) were taken down to his friend, Marya Amsler. This RN and McKenzie, NT witnessed these keys and McKenzie delivered them to [place] at the main entrance near the [employer] entrance. Pt confirmed that his friend Marya Amsler, had received the keys.

## 2019-04-18 ENCOUNTER — Inpatient Hospital Stay (HOSPITAL_COMMUNITY): Payer: No Typology Code available for payment source

## 2019-04-18 DIAGNOSIS — F3181 Bipolar II disorder: Secondary | ICD-10-CM

## 2019-04-18 DIAGNOSIS — I4891 Unspecified atrial fibrillation: Secondary | ICD-10-CM

## 2019-04-18 DIAGNOSIS — F321 Major depressive disorder, single episode, moderate: Secondary | ICD-10-CM

## 2019-04-18 DIAGNOSIS — Z9884 Bariatric surgery status: Secondary | ICD-10-CM

## 2019-04-18 LAB — CBC
HCT: 40.8 % (ref 39.0–52.0)
Hemoglobin: 13.6 g/dL (ref 13.0–17.0)
MCH: 32.9 pg (ref 26.0–34.0)
MCHC: 33.3 g/dL (ref 30.0–36.0)
MCV: 98.6 fL (ref 80.0–100.0)
Platelets: 240 10*3/uL (ref 150–400)
RBC: 4.14 MIL/uL — ABNORMAL LOW (ref 4.22–5.81)
RDW: 13.4 % (ref 11.5–15.5)
WBC: 6.9 10*3/uL (ref 4.0–10.5)
nRBC: 0 % (ref 0.0–0.2)

## 2019-04-18 LAB — BASIC METABOLIC PANEL
Anion gap: 10 (ref 5–15)
BUN: 13 mg/dL (ref 6–20)
CO2: 21 mmol/L — ABNORMAL LOW (ref 22–32)
Calcium: 8.6 mg/dL — ABNORMAL LOW (ref 8.9–10.3)
Chloride: 107 mmol/L (ref 98–111)
Creatinine, Ser: 0.8 mg/dL (ref 0.61–1.24)
GFR calc Af Amer: >60 mL/min (ref >60)
GFR calc non Af Amer: >60 mL/min (ref >60)
Glucose, Bld: 92 mg/dL (ref 70–99)
Potassium: 4 mmol/L (ref 3.5–5.1)
Sodium: 138 mmol/L (ref 135–145)

## 2019-04-18 LAB — ECHOCARDIOGRAM COMPLETE
Height: 70 in
Weight: 3449.76 oz

## 2019-04-18 LAB — MAGNESIUM: Magnesium: 2.3 mg/dL (ref 1.7–2.4)

## 2019-04-18 MED ORDER — CHLORHEXIDINE GLUCONATE CLOTH 2 % EX PADS
6.0000 | MEDICATED_PAD | Freq: Every day | CUTANEOUS | Status: DC
  Administered 2019-04-18: 6 via TOPICAL

## 2019-04-18 MED ORDER — DILTIAZEM HCL ER COATED BEADS 120 MG PO CP24
240.0000 mg | ORAL_CAPSULE | Freq: Every day | ORAL | Status: DC
  Administered 2019-04-18: 240 mg via ORAL
  Filled 2019-04-18: qty 2

## 2019-04-18 MED ORDER — DILTIAZEM HCL ER COATED BEADS 240 MG PO CP24: 240.0000 mg | ORAL_CAPSULE | Freq: Every day | ORAL | 0 refills | Status: DC

## 2019-04-18 MED ORDER — APIXABAN 5 MG PO TABS: 5.0000 mg | ORAL_TABLET | Freq: Two times a day (BID) | ORAL | 0 refills | Status: DC

## 2019-04-18 MED ORDER — OMEPRAZOLE 20 MG PO CPDR: 40.0000 mg | DELAYED_RELEASE_CAPSULE | Freq: Every day | ORAL | 0 refills | Status: DC

## 2019-04-18 NOTE — Discharge Summary (Signed)
Discharge Summary  Mario Cameron PIR:[id number] DOB: January 31, 1971  PCP: Inda Coke, PA  Admit date: 04/17/2019 Discharge date: 04/18/2019  Time spent: 35 mins  Recommendations for Outpatient Follow-up:  1. PCP 2. Cardiology 3. EP  Discharge Diagnoses:  Active Hospital Problems   Diagnosis Date Noted   Atrial flutter with rapid ventricular response ([place]) 04/17/2019   Atrial fibrillation, rapid ([place]) 04/17/2019   Depression, major, single episode, moderate ([place]) 11/22/2018   Anxiety 11/22/2018   Bipolar 2 disorder ([place]) 11/22/2018   Hx of gastric bypass 11/22/2018    Resolved Hospital Problems  No resolved problems to display.    Discharge Condition: Stable  Diet recommendation: Heart healthy  Vitals:   04/18/19 1500 04/18/19 1600  BP:    Pulse: 77 74  Resp: 17 18  Temp:    SpO2: 96% 95%    History of present illness:  Mario Helenskeis a 48 y.o.malewith medical history significant ofbipolar disorder, anxiety, remote history of gastric sleeve presents to the emergency room with tachycardia and palpitations after being seen at urgent care earlier today. 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 Patient reports having prior history of palpitations as well as blackout events in the past.Denies any excessive caffeine use, denies any new medications or changes in medications, he denies shortness of breath chest pain leg swelling long distance travel etc. In the emergency room he  was found to be in rapid atrial flutter, he was given adenosine x1 dose,which decreased his heart rate briefly,subsequently remains in rapid atrial flutter, then started on a Cardizem drip.  Patient admitted for further management.   Patient reports feeling better, denies any chest pain, chest pressure, shortness of breath, palpitations.  Patient was able to ambulate around the ICU, without getting short of breath or palpitations.  Heart rate remained in the 70s to 80s.  Patient stable to be discharged with close follow-up with cardiology and EP.  Also follow-up with PCP.  Hospital Course:  Principal Problem:   Atrial flutter with rapid ventricular response (HCC) Active Problems:   Depression, major, single episode, moderate (HCC)   Anxiety   Bipolar 2 disorder (HCC)   Hx of gastric bypass   Atrial fibrillation, rapid (HCC)  Atrial flutter with RVR Currently still in flutter, HR controlled TSH WNL Echo with normal systolic function Cardiology on board: switch p.o. Cardizem, follow-up in outpatient clinic as well as with EP Continue Eliquis  Bipolar 2 disorder/anxiety/depression Continue Effexor, Lamictal, Elavil  History of gastric sleeve Continue multivitamins        Malnutrition Type:      Malnutrition Characteristics:      Nutrition Interventions:      Estimated body mass index is 30.94 kg/m as calculated from the following:   Height as of this encounter: 5\' 10"  (1.778 m).   Weight as of this encounter: 97.8 kg.    Procedures:  None  Consultations:  Cardiology  Discharge Exam: BP 117/61    Pulse 74    Temp 98.3 F (36.8 C) (Oral)    Resp 18  Ht 5\' 10"  (1.778 m)    Wt 97.8 kg    SpO2 95%    BMI 30.94 kg/m   General: NAD Cardiovascular: S1, S2 present Respiratory: CTA B  Discharge Instructions You were cared for by a hospitalist during your hospital stay. If you have any questions about your discharge medications or the care you received  while you were in the hospital after you are discharged, you can call the unit and asked to speak with the hospitalist on call if the hospitalist that took care of you is not available. Once you are discharged, your primary care physician will handle any further medical issues. Please note that NO REFILLS for any discharge medications will be authorized once you are discharged, as it is imperative that you return to your primary care physician (or establish a relationship with a primary care physician if you do not have one) for your aftercare needs so that they can reassess your need for medications and monitor your lab values.   Allergies as of 04/18/2019      Reactions   Ibuprofen    Nsaids    bleeding   Wool Alcohol [lanolin]    sherpa wool      Medication List    STOP taking these medications   atorvastatin 10 MG tablet Commonly known as: LIPITOR   valACYclovir 1000 MG tablet Commonly known as: VALTREX     TAKE these medications   Afrin Saline Nasal Mist 0.65 % nasal spray Generic drug: sodium chloride Place 1 spray into the nose as needed for congestion.   amitriptyline 100 MG tablet Commonly known as: ELAVIL Take 100 mg by mouth at bedtime.   apixaban 5 MG Tabs tablet Commonly known as: ELIQUIS Take 1 tablet (5 mg total) by mouth 2 (two) times daily.   B COMPLEX PO Take 1 tablet by mouth daily. 10000 IU   busPIRone 10 MG tablet Commonly known as: BUSPAR Take 20 mg by mouth at bedtime as needed (sleep). for anxiety   CALCIUM PO Take 1 tablet by mouth daily.   CO Q10 PO Take by mouth.   diltiazem 240 MG 24 hr capsule Commonly known as: CARDIZEM CD Take 1 capsule (240 mg total) by mouth daily for 30 days. Start taking on: April 19, 2019   IRON PO Take 65 mg by mouth daily.   lamoTRIgine 100 MG tablet Commonly known as: LAMICTAL Take 100 mg by mouth daily.   omeprazole 20 MG capsule Commonly known as: PRILOSEC Take 2 capsules (40 mg total) by mouth  daily.   Potassium 75 MG Tabs Take by mouth.   prazosin 5 MG capsule Commonly known as: MINIPRESS Take 5 mg by mouth at bedtime.   TART CHERRY ADVANCED PO Take by mouth.   tretinoin 0.1 % cream Commonly known as: RETIN-A Apply 1 application topically at bedtime.   Turmeric 500 MG Caps Take 1 capsule by mouth daily.   venlafaxine XR 150 MG 24 hr capsule Commonly known as: EFFEXOR-XR Take 300 mg by mouth daily with breakfast.   VITAMIN D PO Take 5,000 tablets by mouth daily. Pt takes 10,000 IU daily   zolpidem 5 MG tablet Commonly known as: AMBIEN Take 5 mg by mouth at bedtime as needed for sleep.      Allergies  Allergen Reactions   Ibuprofen    Nsaids     bleeding   Wool Alcohol [Lanolin]     sherpa wool   Follow-up Information  Marinus Mawaylor, Gregg W, MD Follow up.   Specialty: Cardiology Why: Electrophysiology consultation on Tuesday June 23rd at 10:30. This will be an in office visit unless you are called and told otherwise. Please arrive 15 minutes early for check in.  Contact information: [address] [place] [address] [phone]        Jodelle Redhristopher, Bridgette, MD Follow up.   Specialty: Cardiology Why: General cardiology follow up on 06/24/19 at 9:00. Please arrive 15 minutes early for check in.  Contact information: [address] [place] [address] [phone]        Jarold MottoWorley, Samantha, [place]. Schedule an appointment as soon as possible for a visit in 1 week(s).   Specialty: Physician Assistant Contact information: [address] [place] [address] [phone]            The results of significant diagnostics from this hospitalization (including imaging, microbiology, ancillary and laboratory) are listed below for reference.    Significant Diagnostic Studies: Dg Chest Port 1 View  Result Date: 04/17/2019 CLINICAL DATA:  Atrial fibrillation. Tachycardia. Shortness of breath. EXAM: PORTABLE  CHEST 1 VIEW COMPARISON:  None. FINDINGS: Artifact overlies the chest. Heart and mediastinal shadows are normal. The lungs are clear. No effusions. No abnormal bone finding. IMPRESSION: No active disease. Electronically Signed   By: Paulina FusiMark  Shogry M.D.   On: 04/17/2019 14:25    Microbiology: Recent Results (from the past 240 hour(s))  SARS Coronavirus 2 (CEPHEID - Performed in [facility] hospital lab), Hosp Order     Status: None   Collection Time: 04/17/19  2:19 PM   Specimen: Nasopharyngeal Swab  Result Value Ref Range Status   SARS Coronavirus 2 NEGATIVE NEGATIVE Final    Comment: (NOTE) If result is NEGATIVE SARS-CoV-2 target nucleic acids are NOT DETECTED. The SARS-CoV-2 RNA is generally detectable in upper and lower  respiratory specimens during the acute phase of infection. The lowest  concentration of SARS-CoV-2 viral copies this assay can detect is 250  copies / mL. A negative result does not preclude SARS-CoV-2 infection  and should not be used as the sole basis for treatment or other  patient management decisions.  A negative result may occur with  improper specimen collection / handling, submission of specimen other  than nasopharyngeal swab, presence of viral mutation(s) within the  areas targeted by this assay, and inadequate number of viral copies  (<250 copies / mL). A negative result must be combined with clinical  observations, patient history, and epidemiological information. If result is POSITIVE SARS-CoV-2 target nucleic acids are DETECTED. The SARS-CoV-2 RNA is generally detectable in upper and lower  respiratory specimens dur ing the acute phase of infection.  Positive  results are indicative of active infection with SARS-CoV-2.  Clinical  correlation with patient history and other diagnostic information is  necessary to determine patient infection status.  Positive results do  not rule out bacterial infection or co-infection with other viruses. If result is  PRESUMPTIVE POSTIVE SARS-CoV-2 nucleic acids MAY BE PRESENT.   A presumptive positive result was obtained on the submitted specimen  and confirmed on repeat testing.  While 2019 novel coronavirus  (SARS-CoV-2) nucleic acids may be present in the submitted sample  additional confirmatory testing may be necessary for epidemiological  and / or clinical management purposes  to differentiate between  SARS-CoV-2 and other Sarbecovirus currently known to infect humans.  If clinically indicated additional testing with an alternate test  methodology [phone]([id number]) is  advised. The SARS-CoV-2 RNA is generally  detectable in upper and lower respiratory sp ecimens during the acute  phase of infection. The expected result is Negative. Fact Sheet for Patients:  BoilerBrush.com.cyhttps://www.fda.gov/media/136312/download Fact Sheet for Healthcare Providers: https://pope.com/https://www.fda.gov/media/136313/download This test is not yet approved or cleared by the [place] FDA and has been authorized for detection and/or diagnosis of SARS-CoV-2 by FDA under an Emergency Use Authorization (EUA).  This EUA will remain in effect (meaning this test can be used) for the duration of the COVID-19 declaration under Section 564(b)(1) of the Act, 21 U.S.C. section 360bbb-3(b)(1), unless the authorization is terminated or revoked sooner. Performed at [facility], [address] [place], [place] [address]   MRSA PCR Screening     Status: None   Collection Time: 04/17/19  5:36 PM   Specimen: Nasal Mucosa; Nasopharyngeal  Result Value Ref Range Status   MRSA by PCR NEGATIVE NEGATIVE Final    Comment:        The GeneXpert MRSA Assay (FDA approved for NASAL specimens only), is one component of a comprehensive MRSA colonization surveillance program. It is not intended to diagnose MRSA infection nor to guide or monitor treatment for MRSA infections. Performed at [facility], [address]  [place], [place] [address]      Labs: Basic Metabolic Panel: Recent Labs  Lab 04/17/19 1349 04/17/19 1358 04/18/19 0258  NA 138 138 138  K 3.6 3.7 4.0  CL 106 106 107  CO2 23  --  21*  GLUCOSE 131* 129* 92  BUN 15 14 13   CREATININE 0.90 0.90 0.80  CALCIUM 9.0  --  8.6*  MG  --   --  2.3   Liver Function Tests: Recent Labs  Lab 04/17/19 1349  AST 16  ALT 15  ALKPHOS 66  BILITOT 0.6  PROT 7.4  ALBUMIN 4.5   No results for input(s): LIPASE, AMYLASE in the last 168 hours. No results for input(s): AMMONIA in the last 168 hours. CBC: Recent Labs  Lab 04/17/19 1349 04/17/19 1358 04/18/19 0258  WBC 5.1  --  6.9  NEUTROABS 2.8  --   --   HGB 13.7 13.9 13.6  HCT 42.3 41.0 40.8  MCV 97.2  --  98.6  PLT 240  --  240   Cardiac Enzymes: No results for input(s): CKTOTAL, CKMB, CKMBINDEX, TROPONINI in the last 168 hours. BNP: BNP (last 3 results) No results for input(s): BNP in the last 8760 hours.  ProBNP (last 3 results) No results for input(s): PROBNP in the last 8760 hours.  CBG: No results for input(s): GLUCAP in the last 168 hours.     Signed:  Briant CedarNkeiruka J Bailey Kolbe, MD [facility] 04/18/2019, 5:01 PM

## 2019-04-18 NOTE — Progress Notes (Signed)
PROGRESS NOTE  Mario Cameron SNK:[id number] DOB: 04-Jan-1971 DOA: 04/17/2019 PCP: Inda Coke, PA  HPI/Recap of past 24 hours: HPI from Dr Anthoney Harada [facility] is a 48 y.o. male with medical history significant of bipolar disorder, anxiety, remote history of gastric sleeve presents to the emergency room with tachycardia and palpitations after being seen at urgent care earlier today. 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 Patient reports having prior history of palpitations as well as blackout events in the past.  Denies any excessive caffeine use, denies any new medications or changes in medications, he denies shortness of breath chest pain leg swelling long distance travel etc. In the emergency room he was found to be in rapid atrial flutter, he was given adenosine x1 dose, which decreased his heart rate briefly, subsequently remains in rapid atrial flutter, then started on a Cardizem drip.  Patient admitted for further management.   Today, patient denies any palpitations, chest pain/pressure, shortness of breath, abdominal pain, nausea/vomiting.  Patient still in flutter   Assessment/Plan: Principal Problem:   Atrial flutter with rapid ventricular response (HCC) Active Problems:   Depression, major, single episode, moderate (HCC)   Anxiety   Bipolar 2 disorder (HCC)   Hx of gastric bypass   Atrial fibrillation, rapid (HCC)  Atrial flutter with RVR Currently still in flutter, HR somewhat  controlled TSH WNL Echo pending Cardiology on board: switch from Cardizem drip to p.o. Cardizem Continue Eliquis Monitor closely  Bipolar 2 disorder/anxiety/depression Continue Effexor, Lamictal, Elavil  History of gastric sleeve Continue multivitamins         Malnutrition Type:      Malnutrition Characteristics:      Nutrition Interventions:       Estimated body mass index is 30.94 kg/m as calculated from the following:   Height as of this encounter: 5\' 10"  (1.778 m).   Weight as of this encounter: 97.8 kg.     Code Status: Full  Family Communication: None at bedside  Disposition Plan: Home once significant clinical improvement   Consultants:  Cardiology  Procedures:  None  Antimicrobials:  None  DVT prophylaxis: Eliquis   Objective: Vitals:   04/17/19 2351 04/18/19 0000 04/18/19 0400 04/18/19 0800  BP: 113/71  114/82   Pulse: 76  75   Resp: 15  17   Temp: 97.9 F (36.6 C) 98.1 F (36.7 C) 97.7 F (36.5 C) 98.1 F (36.7 C)  TempSrc:  Oral Oral Oral  SpO2: 95%  96%   Weight:      Height:       No intake or output data in the 24 hours ending 04/18/19 1215 Filed Weights   04/17/19 1800  Weight: 97.8 kg    Exam:  General: NAD   Cardiovascular: S1, S2 present  Respiratory: CTAB  Abdomen: Soft, nontender, nondistended, bowel sounds present  Musculoskeletal: No bilateral pedal edema noted  Skin: Normal  Psychiatry: Normal mood   Data Reviewed: CBC: Recent Labs  Lab 04/17/19 1349 04/17/19 1358 04/18/19 0258  WBC 5.1  --  6.9  NEUTROABS 2.8  --   --   HGB 13.7 13.9 13.6  HCT 42.3 41.0 40.8  MCV 97.2  --  98.6  PLT 240  --  240   Basic Metabolic Panel: Recent Labs  Lab 04/17/19 1349 04/17/19 1358 04/18/19 0258  NA 138 138 138  K 3.6 3.7 4.0  CL 106 106 107  CO2 23  --  21*  GLUCOSE 131* 129* 92  BUN 15 14 13   CREATININE 0.90 0.90 0.80  CALCIUM 9.0  --  8.6*  MG  --   --  2.3   GFR: Estimated  Creatinine Clearance: 136.7 mL/min (by C-G formula based on SCr of 0.8 mg/dL). Liver Function Tests: Recent Labs  Lab 04/17/19 1349  AST 16  ALT 15  ALKPHOS 66  BILITOT 0.6  PROT 7.4  ALBUMIN 4.5   No results for input(s): LIPASE, AMYLASE in the last 168 hours. No results for input(s): AMMONIA in the last 168 hours. Coagulation Profile: No results for input(s): INR, PROTIME in the last 168 hours. Cardiac Enzymes: No results for input(s): CKTOTAL, CKMB, CKMBINDEX, TROPONINI in the last 168 hours. BNP (last 3 results) No results for input(s): PROBNP in the last 8760 hours. HbA1C: No results for input(s): HGBA1C in the last 72 hours. CBG: No results for input(s): GLUCAP in the last 168 hours. Lipid Profile: No results for input(s): CHOL, HDL, LDLCALC, TRIG, CHOLHDL, LDLDIRECT in the last 72 hours. Thyroid Function Tests: Recent Labs    04/17/19 1721  TSH 1.007   Anemia Panel: No results for input(s): VITAMINB12, FOLATE, FERRITIN, TIBC, IRON, RETICCTPCT in the last 72 hours. Urine analysis:    Component Value Date/Time   BILIRUBINUR neg 01/10/2019 1643   PROTEINUR Negative 01/10/2019 1643   UROBILINOGEN 0.2 01/10/2019 1643   NITRITE neg 01/10/2019 1643   LEUKOCYTESUR Negative 01/10/2019 1643   Sepsis Labs: @LABRCNTIP (procalcitonin:4,lacticidven:4)  ) Recent Results (from the past 240 hour(s))  SARS Coronavirus 2 (CEPHEID - Performed in [facility] hospital lab), Hosp Order     Status: None   Collection Time: 04/17/19  2:19 PM   Specimen: Nasopharyngeal Swab  Result Value Ref Range Status   SARS Coronavirus 2 NEGATIVE NEGATIVE Final    Comment: (NOTE) If result is NEGATIVE SARS-CoV-2 target nucleic acids are NOT DETECTED. The SARS-CoV-2 RNA is generally detectable in upper and lower  respiratory specimens during the acute phase of infection. The lowest  concentration of SARS-CoV-2 viral copies this assay can detect is 250  copies / mL. A negative result does not  preclude SARS-CoV-2 infection  and should not be used as the sole basis for treatment or other  patient management decisions.  A negative result may occur with  improper specimen collection / handling, submission of specimen other  than nasopharyngeal swab, presence of viral mutation(s) within the  areas targeted by this assay, and inadequate number of viral copies  (<250 copies / mL). A negative result must be combined with clinical  observations, patient history, and epidemiological information. If result is POSITIVE SARS-CoV-2 target nucleic acids are DETECTED. The SARS-CoV-2 RNA is generally detectable in upper and lower  respiratory specimens dur ing the acute phase of infection.  Positive  results are indicative of active infection with SARS-CoV-2.  Clinical  correlation with patient history and other diagnostic information is  necessary to determine patient infection status.  Positive results do  not rule out bacterial infection or co-infection with other  viruses. If result is PRESUMPTIVE POSTIVE SARS-CoV-2 nucleic acids MAY BE PRESENT.   A presumptive positive result was obtained on the submitted specimen  and confirmed on repeat testing.  While 2019 novel coronavirus  (SARS-CoV-2) nucleic acids may be present in the submitted sample  additional confirmatory testing may be necessary for epidemiological  and / or clinical management purposes  to differentiate between  SARS-CoV-2 and other Sarbecovirus currently known to infect humans.  If clinically indicated additional testing with an alternate test  methodology [phone]([id number]) is advised. The SARS-CoV-2 RNA is generally  detectable in upper and lower respiratory sp ecimens during the acute  phase of infection. The expected result is Negative. Fact Sheet for Patients:  BoilerBrush.com.cyhttps://www.fda.gov/media/136312/download Fact Sheet for Healthcare Providers: https://pope.com/https://www.fda.gov/media/136313/download This test is not yet approved or cleared by  the [place] FDA and has been authorized for detection and/or diagnosis of SARS-CoV-2 by FDA under an Emergency Use Authorization (EUA).  This EUA will remain in effect (meaning this test can be used) for the duration of the COVID-19 declaration under Section 564(b)(1) of the Act, 21 U.S.C. section 360bbb-3(b)(1), unless the authorization is terminated or revoked sooner. Performed at [facility], [address] [place], [place] [address]   MRSA PCR Screening     Status: None   Collection Time: 04/17/19  5:36 PM   Specimen: Nasal Mucosa; Nasopharyngeal  Result Value Ref Range Status   MRSA by PCR NEGATIVE NEGATIVE Final    Comment:        The GeneXpert MRSA Assay (FDA approved for NASAL specimens only), is one component of a comprehensive MRSA colonization surveillance program. It is not intended to diagnose MRSA infection nor to guide or monitor treatment for MRSA infections. Performed at [facility], [address] [place], [place] [address]       Studies: Dg Chest Port 1 View  Result Date: 04/17/2019 CLINICAL DATA:  Atrial fibrillation. Tachycardia. Shortness of breath. EXAM: PORTABLE CHEST 1 VIEW COMPARISON:  None. FINDINGS: Artifact overlies the chest. Heart and mediastinal shadows are normal. The lungs are clear. No effusions. No abnormal bone finding. IMPRESSION: No active disease. Electronically Signed   By: Paulina FusiMark  Shogry M.D.   On: 04/17/2019 14:25    Scheduled Meds:  amitriptyline  50 mg Oral QHS   apixaban  5 mg Oral BID   Chlorhexidine Gluconate Cloth  6 each Topical Daily   diltiazem  240 mg Oral Daily   lamoTRIgine  100 mg Oral Daily   pantoprazole  40 mg Oral Daily   venlafaxine XR  300 mg Oral Q breakfast    Continuous Infusions:   LOS: 1 day     Briant CedarNkeiruka J Ashliegh Parekh, MD [facility]  If 7PM-7AM, please contact night-coverage [email or web address] 04/18/2019, 12:15 PM

## 2019-04-18 NOTE — Progress Notes (Signed)
Progress Note  Patient Name: Mario Cameron Date of Encounter: 04/18/2019  Primary Cardiologist: Jodelle RedBridgette Christopher, MD   Subjective   Pt feeling well. No current palpitations, chest discomfort, shortness of breath or lightheadedness. He did have palpitations with fast heart beat and lightheadedness earlier while lying in bed. This broke when he got up and walked into bathroom.   Inpatient Medications    Scheduled Meds: . amitriptyline  50 mg Oral QHS  . apixaban  5 mg Oral BID  . Chlorhexidine Gluconate Cloth  6 each Topical Daily  . lamoTRIgine  100 mg Oral Daily  . pantoprazole  40 mg Oral Daily  . venlafaxine XR  300 mg Oral Q breakfast   Continuous Infusions: . diltiazem (CARDIZEM) infusion 5 mg/hr (04/18/19 0644)   PRN Meds: acetaminophen **OR** acetaminophen, busPIRone, ondansetron **OR** ondansetron (ZOFRAN) IV, zolpidem   Vital Signs    Vitals:   04/17/19 2351 04/18/19 0000 04/18/19 0400 04/18/19 0800  BP: 113/71  114/82   Pulse: 76  75   Resp: 15  17   Temp: 97.9 F (36.6 C) 98.1 F (36.7 C) 97.7 F (36.5 C) 98.1 F (36.7 C)  TempSrc:  Oral Oral Oral  SpO2: 95%  96%   Weight:      Height:       No intake or output data in the 24 hours ending 04/18/19 1105 Last 3 Weights 04/17/2019 04/17/2019 01/14/2019  Weight (lbs) 215 lb 9.8 oz 215 lb 196 lb 8 oz  Weight (kg) 97.8 kg 97.523 kg 89.132 kg      Telemetry    Atrial flutter with variable conduction, 2:1 and 3:1. Rates in the 80's with episode up to the 150's briefly this am - Personally Reviewed  ECG    No new tracings - Personally Reviewed  Physical Exam   GEN: No acute distress.   Neck: No JVD Cardiac: Irregular, no murmurs, rubs, or gallops.  Respiratory: Clear to auscultation bilaterally. GI: Soft, nontender, non-distended  MS: No edema; No deformity. Neuro:  Nonfocal  Psych: Normal affect   Labs    Chemistry Recent Labs  Lab 04/17/19 1349 04/17/19 1358 04/18/19 0258  NA 138  138 138  K 3.6 3.7 4.0  CL 106 106 107  CO2 23  --  21*  GLUCOSE 131* 129* 92  BUN 15 14 13   CREATININE 0.90 0.90 0.80  CALCIUM 9.0  --  8.6*  PROT 7.4  --   --   ALBUMIN 4.5  --   --   AST 16  --   --   ALT 15  --   --   ALKPHOS 66  --   --   BILITOT 0.6  --   --   GFRNONAA >60  --  >60  GFRAA >60  --  >60  ANIONGAP 9  --  10     Hematology Recent Labs  Lab 04/17/19 1349 04/17/19 1358 04/18/19 0258  WBC 5.1  --  6.9  RBC 4.35  --  4.14*  HGB 13.7 13.9 13.6  HCT 42.3 41.0 40.8  MCV 97.2  --  98.6  MCH 31.5  --  32.9  MCHC 32.4  --  33.3  RDW 13.2  --  13.4  PLT 240  --  240    Cardiac EnzymesNo results for input(s): TROPONINI in the last 168 hours.  Recent Labs  Lab 04/17/19 1356  TROPIPOC 0.00     BNPNo results for input(s): BNP, PROBNP  in the last 168 hours.   DDimer No results for input(s): DDIMER in the last 168 hours.   Radiology    Dg Chest Port 1 View  Result Date: 04/17/2019 CLINICAL DATA:  Atrial fibrillation. Tachycardia. Shortness of breath. EXAM: PORTABLE CHEST 1 VIEW COMPARISON:  None. FINDINGS: Artifact overlies the chest. Heart and mediastinal shadows are normal. The lungs are clear. No effusions. No abnormal bone finding. IMPRESSION: No active disease. Electronically Signed   By: Nelson Chimes M.D.   On: 04/17/2019 14:25    Cardiac Studies   Echo today, pending results  Patient Profile     48 y.o. male with a hx of anxiety and bipolar disorder, prior gastric sleeve surgery in 2015 who is being seen for the evaluation of new onset atrial flutter following a stressful event. He had an epsiode of syncope and has been noted to have these events since his gastric sleeve surgery in 2015.   Assessment & Plan    Atrial flutter -Likely onset 04/13/19. Rapid ventricular rate with 2:1 conduction on presentation.  -No known recent illness or infection. Non-smoker, no illicit drug use or recent alcohol.  -Echo done today, pending results.   -CHA2DS2/VAS Stroke Risk Points=1, but with potential cardioversion or ablation in the future, he will need anticoagulation. Reviewed with him today. Started apixaban. -Currently on IV diltiazem drip. Still in atrial flutter with variable conduction. Ventricular rates in the 80's with brief episode into the 150's this am.  -Will transition to oral cardizem CD 240 mg. He was on drip at 5 mg per hour but will give a little higher oral dose to better control the intermittent fast rate.  -plan to have pt seen by EP as an outpatient for evaluate for possible atrial flutter ablation. I will make appointment and place on AVS.    For questions or updates, please contact [facility] Please consult [email or web address] for contact info under        Signed, Daune Perch, NP  04/18/2019, 11:05 AM

## 2019-04-18 NOTE — Progress Notes (Signed)
  Echocardiogram 2D Echocardiogram has been performed.  Mario Cameron 04/18/2019, 10:17 AM

## 2019-04-18 NOTE — Discharge Instructions (Signed)
Atrial Flutter    Atrial flutter is a type of abnormal heart rhythm (arrhythmia). The heart has an electrical system that tells the heart how to beat. In atrial flutter, the signals move rapidly in the top chambers of the heart (the atria). This makes your heart beat very fast. Atrial flutter can come and go, or it can be permanent.  If this condition is not treated it can cause serious complications, such as stroke or weakened heart muscle (cardiomyopathy).  What are the causes?  This condition may be caused by:   A heart condition or problem, such as:  ? A heart attack.  ? Heart failure.  ? A heart valve problem.  ? Heart surgery.   A lung problem, such as:  ? A blood clot in the lungs (pulmonary embolism, or PE).  ? Chronic obstructive pulmonary disease.   Poorly controlled high blood pressure (hypertension).   Overactive thyroid (hyperthyroidism).   Caffeine.   Some decongestant cold medicines.   Low levels of minerals called electrolytes in the blood.   Cocaine.  What increases the risk?  You are more likely to develop this condition if:   You are an elderly adult.   You are a man.   You are obese.   You have obstructive sleep apnea.   You have a family history of atrial flutter.   You have diabetes.  What are the signs or symptoms?  Symptoms of this condition include:   A feeling that your heart is pounding or racing (palpitations).   Shortness of breath.   Chest pain.   Feeling light-headed.   Dizziness.   Fainting.   Low blood pressure (hypotension).   Fatigue.  Sometimes there are no symptoms associated with arrhythmia.  How is this diagnosed?  This condition may be diagnosed based on:   An electrocardiogram (ECG). This is a test that records the electrical signals in the heart.   Ambulatory cardiac monitoring. This is a small recording device that is connected by wires to flat, sticky disks (electrodes) that are attached to your chest.   An echocardiogram. This is a test that uses  sound waves to create pictures of your heart.   A transesophageal echocardiogram (TEE). In this test, a device is placed down your esophagus. This device then uses sounds waves to create even closer pictures of your heart.   Stress test. This test records your heartbeat while you exercise and checks to see if the heart muscle is receiving adequate blood supply.  How is this treated?  This condition may be treated with:   Medicines to:  ? Make your heart beat more slowly.  ? Keep your heart in normal rhythm.  ? Prevent a stroke.   Cardioversion. This uses medicines or an electrical shock to make the heart beat normally.   Ablation. This destroys the heart tissue that is causing the problem.  In some cases, your health care provider will treat other underlying conditions.  Follow these instructions at home:  Medicines   Take over-the-counter and prescription medicines only as told by your health care provider.  ? Make sure you take your medicines exactly as told by your health care provider.  ? Do not miss any doses.   Do not take any new medicines without talking to your health care provider.  Lifestyle   Eat heart-healthy foods. Talk with a dietitian to make an eating plan that is right for you.   Do not use any products that   contain nicotine or tobacco, such as cigarettes and e-cigarettes. If you need help quitting, ask your health care provider.   Limit alcohol intake to no more than 1 drink per day for nonpregnant women and 2 drinks per day for men. One drink equals 12 oz of beer, 5 oz of wine, or 1 oz of hard liquor.   Try to reduce any stress. Stress can make your symptoms worse.   Get screened for sleep apnea. If you have the condition, work with your health care provider to find a treatment that works for you.   Do not use drugs.   Avoid excessive caffeine.  General instructions   Lose weight if your health care provider tells you to do that.   Keep all follow-up visits as told by your health  care provider. This is important.  Contact a health care provider if:   Your symptoms get worse.   You notice that your palpitations are increasing.  Get help right away if:   You have any symptoms of a stroke. "BE FAST" is an easy way to remember the main warning signs of a stroke:  ? B - Balance. Signs are dizziness, sudden trouble walking, or loss of balance.  ? E - Eyes. Signs are trouble seeing or a sudden change in vision.  ? F - Face. Signs are sudden weakness or numbness of the face, or the face or eyelid drooping on one side.  ? A - Arms. Signs are weakness or numbness in an arm. This happens suddenly and usually on one side of the body.  ? S - Speech. Signs are sudden trouble speaking, slurred speech, or trouble understanding what people say.  ? T - Time. Time to call emergency services. Write down what time symptoms started.   You have other signs of a stroke, such as:  ? A sudden, severe headache with no known cause.  ? Nausea or vomiting.  ? Seizure.   You have additional symptoms, such as:  ? Fainting.  ? Shortness of breath.  ? Pain or pressure in your chest.  ? Suddenly feeling nauseous or suddenly vomiting.  ? Increased sweating with no known cause.   These symptoms may represent a serious problem that is an emergency. Do not wait to see if the symptoms will go away. Get medical help right away. Call your local emergency services (911 in the [place] Do not drive yourself to the hospital.  Summary   Atrial flutter is an abnormal heart rhythm that can give you symptoms of palpitations, shortness of breath, or fatigue.   Atrial flutter is often treated with medicines to keep your heart in a normal rhythm and to prevent a stroke.   You should seek immediate help if you cannot catch your breath, have chest pain or pressure, or have weakness, especially on one side of your body.  This information is not intended to replace advice given to you by your health care provider. Make sure you discuss any  questions you have with your health care provider.  Document Released: 03/11/2009 Document Revised: 07/12/2018 Document Reviewed: 07/26/2017  Elsevier Interactive Patient Education  2019 Elsevier Inc.

## 2019-04-21 DIAGNOSIS — I4892 Unspecified atrial flutter: Secondary | ICD-10-CM

## 2019-04-21 MED ORDER — RIVAROXABAN 20 MG PO TABS: 20.0000 mg | ORAL_TABLET | Freq: Every day | ORAL | 11 refills | Status: DC

## 2019-04-21 NOTE — Telephone Encounter (Signed)
Spoke with patient, walked him thru how to get a $10 copay card for Eliquis.  His copay with insurance was $475, so card should last about 8 months (annually).  That will get him thru 2020 and to at least August of 2021.    Patient voiced understanding and appreciation.

## 2019-04-21 NOTE — Telephone Encounter (Signed)
Patient requesting change in prescription as coupon for apixaban not being accepted. Changing to rivaroxaban. Also noted option for coumadin, though this will require monitoring.

## 2019-04-21 NOTE — Telephone Encounter (Signed)
Hi team--this is a pt I saw in the hospital with new flutter. I thought we had his DOAC all worked out, but apparently it is still very expensive. Can you help him see what options he might have? He'll need to be on [facility] consistently before he can be ablated. Thanks!

## 2019-04-22 ENCOUNTER — Telehealth: Payer: Self-pay

## 2019-04-22 ENCOUNTER — Telehealth: Payer: Self-pay | Admitting: *Deleted

## 2019-04-22 NOTE — Telephone Encounter (Signed)
Left message on personal voicemail to call office to schedule hospital follow up with [facility].

## 2019-04-22 NOTE — Telephone Encounter (Signed)
Per Chart Review:  PCP: Inda Coke, PA  Admit date: 04/17/2019 Discharge date: 04/18/2019  Time spent: 35 mins  Recommendations for Outpatient Follow-up:  1. PCP 2. Cardiology 3. EP  Discharge Diagnoses:      Active Hospital Problems   Diagnosis Date Noted  . Atrial flutter with rapid ventricular response ([place]) 04/17/2019  . Atrial fibrillation, rapid ([place]) 04/17/2019  . Depression, major, single episode, moderate ([place]) 11/22/2018  . Anxiety 11/22/2018  . Bipolar 2 disorder ([place]) 11/22/2018  . Hx of gastric bypass 11/22/2018    Resolved Hospital Problems  No resolved problems to display.    Discharge Condition: Stable  Diet recommendation: Heart healthy      Vitals:   04/18/19 1500 04/18/19 1600  BP:    Pulse: 77 74  Resp: 17 18  Temp:    SpO2: 96% 95%    History of present illness:  Mario Cameron a 48 y.o.malewith medical history significant ofbipolar disorder, anxiety, remote history of gastric sleeve presents to the emergency room with tachycardia and palpitations after being seen at urgent care earlier today. 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 Patient reports having prior history of palpitations as well as blackout events in the past.Denies any excessive caffeine use, denies any new medications or changes in medications, he denies shortness of breath chest pain leg swelling long distance travel etc. In the emergency room he was found to be in rapid  atrial flutter, he was given adenosine x1 dose,which decreased his heart rate briefly,subsequently remains in rapid atrial flutter, then started on a Cardizem drip.Patient admitted for further management.  _______________________________________________________________  Per Telephone Call:  Transition Care Management Follow-up Telephone Call   Date discharged?  04/18/2019 @ 5:01 pm   How have you been since you were released from the hospital? "Better, but my insurance will not cover the blood thinner they want me to start.  They will not cover the Eliquis or the Xarelto."   Do you understand why you were in the hospital? yes   Do you understand the discharge instructions? yes   Where were you discharged to? Home   Items Reviewed:  Medications reviewed: yes  Allergies reviewed: yes  Dietary changes reviewed: yes  Referrals reviewed: yes   Functional Questionnaire:   Activities of Daily Living (ADLs):   He states they are independent in the following: ambulation, bathing and hygiene, feeding, continence, grooming, toileting and dressing States they require assistance with the following: none   Any transportation issues/concerns?: no   Any patient concerns? Yes, unable to afford Eliquis or Xarelto   Confirmed importance and date/time of follow-up visits scheduled yes  Provider Appointment booked with Inda Coke, PA on 04/23/2019 @ 8:20 am  Confirmed with patient if condition begins to worsen call PCP or go to the ER.  Patient was given the office number and encouraged to call back with question or concerns.  : yes

## 2019-04-22 NOTE — Telephone Encounter (Signed)
LM for pt to call back, needs COVID screening prior to appt with Dr Taylor on 6/23 

## 2019-04-23 ENCOUNTER — Ambulatory Visit: Payer: No Typology Code available for payment source | Admitting: Physician Assistant

## 2019-04-23 NOTE — Progress Notes (Signed)
Patient was a no show.  Inda Coke PA-C

## 2019-04-24 ENCOUNTER — Ambulatory Visit (INDEPENDENT_AMBULATORY_CARE_PROVIDER_SITE_OTHER): Payer: No Typology Code available for payment source | Admitting: Physician Assistant

## 2019-04-24 ENCOUNTER — Encounter: Payer: Self-pay | Admitting: Physician Assistant

## 2019-04-24 DIAGNOSIS — I4891 Unspecified atrial fibrillation: Secondary | ICD-10-CM | POA: Diagnosis not present

## 2019-04-24 MED ORDER — RIVAROXABAN 20 MG PO TABS: 20.0000 mg | ORAL_TABLET | Freq: Every day | ORAL | 0 refills | Status: DC

## 2019-04-24 NOTE — Progress Notes (Signed)
Virtual Visit via Video   I connected with Mario Cameron on 04/24/19 at 11:00 AM EDT by a video enabled telemedicine application and verified that I am speaking with the correct person using two identifiers. Location patient: Home Location provider: [facility], Office Persons participating in the virtual visit: Lexie Demontrae, [place] PA-C.  I discussed the limitations of evaluation and management by telemedicine and the availability of in person appointments. The patient expressed understanding and agreed to proceed.  I acted as a Education administrator for [employer], [employer], LPN  Subjective:   HPI:  Hospital follow up Pt following up today on hospitalization 04/17/19 for Atrial flutter with rapid ventricular response. Pt said he is feeling a little better still having palpitations off and on. Denis chest pain and SOB. Pt is scheduled to see Cardiology on 04/29/19. Physically feels much improved.  He is having significant financial difficulty affording Xarelto and upcoming possible ablation. He is working on obtaining [employer]. He has had worsening mood because of this. He continues to see his psychiatrist. Denies SI/HI.  ROS: See pertinent positives and negatives per HPI.  Patient Active Problem List   Diagnosis Date Noted  . Atrial flutter with rapid ventricular response ([place]) 04/17/2019  . Atrial fibrillation, rapid ([place]) 04/17/2019  . 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 Dysuria 01/11/2019  . 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 Depression, major, single episode, moderate ([place]) 11/22/2018  . Anxiety 11/22/2018  . Bipolar 2 disorder ([place]) 11/22/2018  . 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 Smoking status: Never Smoker  . Smokeless tobacco: Never Used  Substance Use Topics  . Alcohol use: Not Currently    Current Outpatient Medications:  .  amitriptyline  (ELAVIL) 100 MG tablet, Take 100 mg by mouth at bedtime., Disp: , Rfl:  .  B Complex Vitamins (B COMPLEX PO), Take 1 tablet by mouth daily. 10000 IU, Disp: , Rfl:  .  busPIRone (BUSPAR) 10 MG tablet, Take 20 mg by mouth at bedtime as needed (sleep). for anxiety, Disp: , Rfl:  .  CALCIUM PO, Take 1 tablet by mouth daily. , Disp: , Rfl:  .  Coenzyme Q10 (CO Q10 PO), Take by mouth., Disp: , Rfl:  .  diltiazem (CARDIZEM CD) 240 MG 24 hr capsule, Take 1 capsule (240 mg total) by mouth daily for 30 days., Disp: 30 capsule, Rfl: 0 .  IRON PO, Take 65 mg by mouth daily. , Disp: , Rfl:  .  lamoTRIgine (LAMICTAL) 100 MG tablet, Take 200 mg by mouth daily. , Disp: , Rfl:  .  Misc Natural Products (TART CHERRY ADVANCED PO), Take by mouth., Disp: , Rfl:  .  omeprazole (PRILOSEC) 20 MG capsule, Take 2 capsules (40 mg total) by mouth daily., Disp: 60 capsule, Rfl: 0 .  Potassium 75 MG TABS, Take by mouth., Disp: , Rfl:  .  prazosin (MINIPRESS) 5 MG capsule, Take 5 mg by mouth at bedtime., Disp: , Rfl:  .  rivaroxaban (XARELTO) 20 MG TABS tablet, Take 1 tablet (20 mg total) by mouth daily with supper., Disp: 30 tablet, Rfl: 11 .  sodium chloride (AFRIN SALINE NASAL MIST) 0.65 % nasal spray, Place 1 spray into the nose as needed for congestion., Disp: , Rfl:  .  tretinoin (RETIN-A) 0.1 % cream, Apply 1 application topically at bedtime., Disp: , Rfl:  .  Turmeric  500 MG CAPS, Take 1 capsule by mouth daily., Disp: , Rfl:  .  venlafaxine XR (EFFEXOR-XR) 150 MG 24 hr capsule, Take 300 mg by mouth daily with breakfast., Disp: , Rfl:  .  VITAMIN D PO, Take 5,000 tablets by mouth daily. Pt takes 10,000 IU daily, Disp: , Rfl:  .  zolpidem (AMBIEN) 5 MG tablet, Take 5 mg by mouth at bedtime as needed for sleep. , Disp: , Rfl:  .  rivaroxaban (XARELTO) 20 MG TABS tablet, Take 1 tablet (20 mg total) by mouth daily with supper., Disp: 42 tablet, Rfl: 0  Allergies  Allergen Reactions  . Ibuprofen   . Nsaids     bleeding   . Wool Alcohol [Lanolin]     sherpa wool    Objective:   VITALS: Per patient if applicable, see vitals. GENERAL: Alert, appears well and in no acute distress. HEENT: Atraumatic, conjunctiva clear, no obvious abnormalities on inspection of external nose and ears. NECK: Normal movements of the head and neck. CARDIOPULMONARY: No increased WOB. Speaking in clear sentences. I:E ratio WNL.  MS: Moves all visible extremities without noticeable abnormality. PSYCH: Pleasant and cooperative, well-groomed. Speech normal rate and rhythm. Affect is appropriate. Insight and judgement are appropriate. Attention is focused, linear, and appropriate.  NEURO: CN grossly intact. Oriented as arrived to appointment on time with no prompting. Moves both UE equally.  SKIN: No obvious lesions, wounds, erythema, or cyanosis noted on face or hands.  Assessment and Plan:   Mario Cameron was seen today for hospitalization follow-up.  Diagnoses and all orders for this visit:  Atrial fibrillation, rapid (HCC)  Other orders -     rivaroxaban (XARELTO) 20 MG TABS tablet; Take 1 tablet (20 mg total) by mouth daily with supper.   Overall doing well, but having difficulty affording xarelto. Appointment with cardiology in 1 week. I have given him 6 weeks of samples of 20 mg xarelto. I asked patient to let me know if he needs further assistance and I will help as much as possible.  . Reviewed expectations re: course of current medical issues. . Discussed self-management of symptoms. . Outlined signs and symptoms indicating need for more acute intervention. . Patient verbalized understanding and all questions were answered. Marland Kitchen. Health Maintenance issues including appropriate healthy diet, exercise, and smoking avoidance were discussed with patient. . See orders for this visit as documented in the electronic medical record.  I discussed the assessment and treatment plan with the patient. The patient was provided an opportunity  to ask questions and all were answered. The patient agreed with the plan and demonstrated an understanding of the instructions.   The patient was advised to call back or seek an in-person evaluation if the symptoms worsen or if the condition fails to improve as anticipated.   CMA or LPN served as scribe during this visit. History, Physical, and Plan performed by medical provider. The above documentation has been reviewed and is accurate and complete.   Mackinaw CitySamantha Ambar Raphael, GeorgiaPA 04/24/2019

## 2019-04-29 ENCOUNTER — Ambulatory Visit (INDEPENDENT_AMBULATORY_CARE_PROVIDER_SITE_OTHER): Payer: No Typology Code available for payment source | Admitting: Internal Medicine

## 2019-04-29 ENCOUNTER — Other Ambulatory Visit: Payer: Self-pay

## 2019-04-29 ENCOUNTER — Encounter: Payer: Self-pay | Admitting: Internal Medicine

## 2019-04-29 DIAGNOSIS — I4892 Unspecified atrial flutter: Secondary | ICD-10-CM | POA: Insufficient documentation

## 2019-04-29 MED ORDER — FLECAINIDE ACETATE 100 MG PO TABS: ORAL_TABLET | ORAL | 3 refills | Status: DC
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
## 2019-04-29 NOTE — Patient Instructions (Signed)
Medication Instructions:  Your physician has recommended you make the following change in your medication:   1.  Continue your cardizem  2.  Stop taking your XARELTO in one month (May 29, 2019)  3.  Start taking flecainide 100 mg--Take one tablet as needed for palpitations.  You may take up to 3 tablets in one day   Labwork: None ordered.  Testing/Procedures: None ordered.  Follow-Up: Your physician wants you to follow-up in: 4-5 months with Dr. Lovena Le.   You will receive a reminder letter in the mail two months in advance. If you don't receive a letter, please call our office to schedule the follow-up appointment.  Any Other Special Instructions Will Be Listed Below (If Applicable).  If you need a refill on your cardiac medications before your next appointment, please call your pharmacy.

## 2019-05-05 ENCOUNTER — Emergency Department (HOSPITAL_COMMUNITY): Payer: No Typology Code available for payment source

## 2019-05-05 ENCOUNTER — Encounter (HOSPITAL_COMMUNITY): Payer: Self-pay | Admitting: Emergency Medicine

## 2019-05-05 ENCOUNTER — Inpatient Hospital Stay (HOSPITAL_COMMUNITY)
Admission: EM | Admit: 2019-05-05 | Discharge: 2019-05-12 | DRG: 917 | Disposition: A | Discharge status: Other Acute Inpatient Hospital | Payer: No Typology Code available for payment source | Attending: Family Medicine | Admitting: Family Medicine

## 2019-05-05 DIAGNOSIS — G9340 Encephalopathy, unspecified: Secondary | ICD-10-CM | POA: Diagnosis present

## 2019-05-05 DIAGNOSIS — T50902A Poisoning by unspecified drugs, medicaments and biological substances, intentional self-harm, initial encounter: Secondary | ICD-10-CM | POA: Diagnosis not present

## 2019-05-05 DIAGNOSIS — I4892 Unspecified atrial flutter: Secondary | ICD-10-CM | POA: Diagnosis present

## 2019-05-05 DIAGNOSIS — R4182 Altered mental status, unspecified: Secondary | ICD-10-CM | POA: Diagnosis not present

## 2019-05-05 DIAGNOSIS — K219 Gastro-esophageal reflux disease without esophagitis: Secondary | ICD-10-CM | POA: Diagnosis present

## 2019-05-05 DIAGNOSIS — T426X2A Poisoning by other antiepileptic and sedative-hypnotic drugs, intentional self-harm, initial encounter: Principal | ICD-10-CM | POA: Diagnosis present

## 2019-05-05 DIAGNOSIS — R45851 Suicidal ideations: Secondary | ICD-10-CM | POA: Diagnosis not present

## 2019-05-05 DIAGNOSIS — T1491XA Suicide attempt, initial encounter: Secondary | ICD-10-CM | POA: Diagnosis not present

## 2019-05-05 DIAGNOSIS — R402342 Coma scale, best motor response, flexion withdrawal, at arrival to emergency department: Secondary | ICD-10-CM | POA: Diagnosis present

## 2019-05-05 DIAGNOSIS — G9341 Metabolic encephalopathy: Secondary | ICD-10-CM | POA: Diagnosis not present

## 2019-05-05 DIAGNOSIS — J9600 Acute respiratory failure, unspecified whether with hypoxia or hypercapnia: Secondary | ICD-10-CM | POA: Diagnosis present

## 2019-05-05 DIAGNOSIS — I4891 Unspecified atrial fibrillation: Secondary | ICD-10-CM | POA: Diagnosis present

## 2019-05-05 DIAGNOSIS — R402212 Coma scale, best verbal response, none, at arrival to emergency department: Secondary | ICD-10-CM | POA: Diagnosis present

## 2019-05-05 DIAGNOSIS — Z91048 Other nonmedicinal substance allergy status: Secondary | ICD-10-CM | POA: Diagnosis not present

## 2019-05-05 DIAGNOSIS — R4189 Other symptoms and signs involving cognitive functions and awareness: Secondary | ICD-10-CM | POA: Diagnosis present

## 2019-05-05 DIAGNOSIS — F419 Anxiety disorder, unspecified: Secondary | ICD-10-CM | POA: Diagnosis present

## 2019-05-05 DIAGNOSIS — F333 Major depressive disorder, recurrent, severe with psychotic symptoms: Secondary | ICD-10-CM | POA: Diagnosis not present

## 2019-05-05 DIAGNOSIS — G43909 Migraine, unspecified, not intractable, without status migrainosus: Secondary | ICD-10-CM | POA: Diagnosis present

## 2019-05-05 DIAGNOSIS — Z886 Allergy status to analgesic agent status: Secondary | ICD-10-CM | POA: Diagnosis not present

## 2019-05-05 DIAGNOSIS — Z1159 Encounter for screening for other viral diseases: Secondary | ICD-10-CM

## 2019-05-05 DIAGNOSIS — R402112 Coma scale, eyes open, never, at arrival to emergency department: Secondary | ICD-10-CM | POA: Diagnosis present

## 2019-05-05 DIAGNOSIS — E876 Hypokalemia: Secondary | ICD-10-CM | POA: Diagnosis not present

## 2019-05-05 DIAGNOSIS — F3181 Bipolar II disorder: Secondary | ICD-10-CM | POA: Diagnosis present

## 2019-05-05 DIAGNOSIS — Z9289 Personal history of other medical treatment: Secondary | ICD-10-CM

## 2019-05-05 LAB — POCT I-STAT EG7
Acid-base deficit: 3 mmol/L — ABNORMAL HIGH (ref 0.0–2.0)
Bicarbonate: 23 mmol/L (ref 20.0–28.0)
Calcium, Ion: 1.18 mmol/L (ref 1.15–1.40)
HCT: 35 % — ABNORMAL LOW (ref 39.0–52.0)
Hemoglobin: 11.9 g/dL — ABNORMAL LOW (ref 13.0–17.0)
O2 Saturation: 100 %
Potassium: 3.4 mmol/L — ABNORMAL LOW (ref 3.5–5.1)
Sodium: 140 mmol/L (ref 135–145)
TCO2: 24 mmol/L (ref 22–32)
pCO2, Ven: 41.9 mmHg — ABNORMAL LOW (ref 44.0–60.0)
pH, Ven: 7.346 (ref 7.250–7.430)
pO2, Ven: 210 mmHg — ABNORMAL HIGH (ref 32.0–45.0)

## 2019-05-05 LAB — BASIC METABOLIC PANEL
Anion gap: 11 (ref 5–15)
BUN: 10 mg/dL (ref 6–20)
CO2: 21 mmol/L — ABNORMAL LOW (ref 22–32)
Calcium: 8.6 mg/dL — ABNORMAL LOW (ref 8.9–10.3)
Chloride: 103 mmol/L (ref 98–111)
Creatinine, Ser: 1.04 mg/dL (ref 0.61–1.24)
GFR calc Af Amer: >60 mL/min (ref >60)
GFR calc non Af Amer: >60 mL/min (ref >60)
Glucose, Bld: 145 mg/dL — ABNORMAL HIGH (ref 70–99)
Potassium: 3.1 mmol/L — ABNORMAL LOW (ref 3.5–5.1)
Sodium: 135 mmol/L (ref 135–145)

## 2019-05-05 LAB — CBG MONITORING, ED: Glucose-Capillary: 134 mg/dL — ABNORMAL HIGH (ref 70–99)

## 2019-05-05 LAB — RAPID URINE DRUG SCREEN, HOSP PERFORMED
Amphetamines: NOT DETECTED
Barbiturates: NOT DETECTED
Benzodiazepines: NOT DETECTED
Cocaine: NOT DETECTED
Opiates: NOT DETECTED
Tetrahydrocannabinol: NOT DETECTED

## 2019-05-05 LAB — TROPONIN I (HIGH SENSITIVITY)
Troponin I (High Sensitivity): <2 ng/L (ref <18)
Troponin I (High Sensitivity): 3 ng/L (ref <18)

## 2019-05-05 LAB — URINALYSIS, ROUTINE W REFLEX MICROSCOPIC
Bilirubin Urine: NEGATIVE
Glucose, UA: NEGATIVE mg/dL
Hgb urine dipstick: NEGATIVE
Ketones, ur: NEGATIVE mg/dL
Leukocytes,Ua: NEGATIVE
Nitrite: NEGATIVE
Protein, ur: 30 mg/dL — AB
Specific Gravity, Urine: 1.013 (ref 1.005–1.030)
pH: 6 (ref 5.0–8.0)

## 2019-05-05 LAB — HEPATIC FUNCTION PANEL
ALT: 17 U/L (ref 0–44)
AST: 17 U/L (ref 15–41)
Albumin: 3.9 g/dL (ref 3.5–5.0)
Alkaline Phosphatase: 69 U/L (ref 38–126)
Bilirubin, Direct: 0.1 mg/dL (ref 0.0–0.2)
Indirect Bilirubin: 0.4 mg/dL (ref 0.3–0.9)
Total Bilirubin: 0.5 mg/dL (ref 0.3–1.2)
Total Protein: 6.3 g/dL — ABNORMAL LOW (ref 6.5–8.1)

## 2019-05-05 LAB — POCT I-STAT 7, (LYTES, BLD GAS, ICA,H+H)
Acid-base deficit: 2 mmol/L (ref 0.0–2.0)
Bicarbonate: 23.5 mmol/L (ref 20.0–28.0)
Calcium, Ion: 1.2 mmol/L (ref 1.15–1.40)
HCT: 35 % — ABNORMAL LOW (ref 39.0–52.0)
Hemoglobin: 11.9 g/dL — ABNORMAL LOW (ref 13.0–17.0)
O2 Saturation: 100 %
Patient temperature: 98.6
Potassium: 3.6 mmol/L (ref 3.5–5.1)
Sodium: 139 mmol/L (ref 135–145)
TCO2: 25 mmol/L (ref 22–32)
pCO2 arterial: 41.1 mmHg (ref 32.0–48.0)
pH, Arterial: 7.366 (ref 7.350–7.450)
pO2, Arterial: 287 mmHg — ABNORMAL HIGH (ref 83.0–108.0)

## 2019-05-05 LAB — AMMONIA: Ammonia: 18 umol/L (ref 9–35)

## 2019-05-05 LAB — ACETAMINOPHEN LEVEL: Acetaminophen (Tylenol), Serum: <10 ug/mL — ABNORMAL LOW (ref 10–30)

## 2019-05-05 LAB — CBC WITH DIFFERENTIAL/PLATELET
Abs Immature Granulocytes: 0.03 10*3/uL (ref 0.00–0.07)
Basophils Absolute: 0 10*3/uL (ref 0.0–0.1)
Basophils Relative: 0 %
Eosinophils Absolute: 0 10*3/uL (ref 0.0–0.5)
Eosinophils Relative: 1 %
HCT: 40.7 % (ref 39.0–52.0)
Hemoglobin: 13.3 g/dL (ref 13.0–17.0)
Immature Granulocytes: 0 %
Lymphocytes Relative: 19 %
Lymphs Abs: 1.6 10*3/uL (ref 0.7–4.0)
MCH: 32 pg (ref 26.0–34.0)
MCHC: 32.7 g/dL (ref 30.0–36.0)
MCV: 97.8 fL (ref 80.0–100.0)
Monocytes Absolute: 0.7 10*3/uL (ref 0.1–1.0)
Monocytes Relative: 8 %
Neutro Abs: 5.9 10*3/uL (ref 1.7–7.7)
Neutrophils Relative %: 72 %
Platelets: 243 10*3/uL (ref 150–400)
RBC: 4.16 MIL/uL — ABNORMAL LOW (ref 4.22–5.81)
RDW: 13.1 % (ref 11.5–15.5)
WBC: 8.3 10*3/uL (ref 4.0–10.5)
nRBC: 0 % (ref 0.0–0.2)

## 2019-05-05 LAB — ETHANOL: Alcohol, Ethyl (B): <10 mg/dL (ref <10)

## 2019-05-05 LAB — LACTIC ACID, PLASMA
Lactic Acid, Venous: 3.1 mmol/L (ref 0.5–1.9)
Lactic Acid, Venous: 1.8 mmol/L (ref 0.5–1.9)

## 2019-05-05 LAB — SARS CORONAVIRUS 2 BY RT PCR (HOSPITAL ORDER, PERFORMED IN ~~LOC~~ HOSPITAL LAB): SARS Coronavirus 2: NEGATIVE

## 2019-05-05 LAB — SALICYLATE LEVEL: Salicylate Lvl: <7 mg/dL (ref 2.8–30.0)

## 2019-05-05 LAB — BRAIN NATRIURETIC PEPTIDE: B Natriuretic Peptide: 9.4 pg/mL (ref 0.0–100.0)

## 2019-05-05 MED ORDER — PROPOFOL 1000 MG/100ML IV EMUL
0.0000 ug/kg/min | INTRAVENOUS | Status: DC
  Administered 2019-05-06: 5 ug/kg/min via INTRAVENOUS
  Administered 2019-05-06: 50 ug/kg/min via INTRAVENOUS
  Filled 2019-05-05: qty 100

## 2019-05-05 MED ORDER — DOCUSATE SODIUM 50 MG/5ML PO LIQD
100.0000 mg | Freq: Two times a day (BID) | ORAL | Status: DC | PRN
  Filled 2019-05-05: qty 10

## 2019-05-05 MED ORDER — FENTANYL CITRATE (PF) 100 MCG/2ML IJ SOLN: 50.0000 ug | INTRAMUSCULAR | Status: DC | PRN

## 2019-05-05 MED ORDER — PANTOPRAZOLE SODIUM 40 MG IV SOLR
40.0000 mg | Freq: Every day | INTRAVENOUS | Status: DC
  Administered 2019-05-06: 40 mg via INTRAVENOUS
  Filled 2019-05-05 (×2): qty 40

## 2019-05-05 MED ORDER — PROPOFOL 1000 MG/100ML IV EMUL
INTRAVENOUS | Status: AC
  Administered 2019-05-05: 5 ug/kg/min
  Filled 2019-05-05: qty 100

## 2019-05-05 MED ORDER — HEPARIN SODIUM (PORCINE) 5000 UNIT/ML IJ SOLN
5000.0000 [IU] | Freq: Three times a day (TID) | INTRAMUSCULAR | Status: DC
  Administered 2019-05-06 – 2019-05-10 (×12): 5000 [IU] via SUBCUTANEOUS
  Filled 2019-05-05 (×14): qty 1

## 2019-05-05 MED ORDER — BISACODYL 10 MG RE SUPP: 10.0000 mg | Freq: Every day | RECTAL | Status: DC | PRN

## 2019-05-05 MED ORDER — ETOMIDATE 2 MG/ML IV SOLN
INTRAVENOUS | Status: AC | PRN
  Administered 2019-05-05: 30 mg via INTRAVENOUS

## 2019-05-05 MED ORDER — NALOXONE HCL 2 MG/2ML IJ SOSY
PREFILLED_SYRINGE | INTRAMUSCULAR | Status: AC
  Filled 2019-05-05: qty 2

## 2019-05-05 MED ORDER — ROCURONIUM BROMIDE 50 MG/5ML IV SOLN
INTRAVENOUS | Status: AC | PRN
  Administered 2019-05-05: 100 mg via INTRAVENOUS

## 2019-05-05 MED ORDER — SODIUM BICARBONATE 8.4 % IV SOLN
50.0000 meq | Freq: Once | INTRAVENOUS | Status: AC
  Administered 2019-05-05: 50 meq via INTRAVENOUS
  Filled 2019-05-05: qty 50

## 2019-05-05 NOTE — ED Triage Notes (Signed)
Pt BIB [facility], found unresponsive at his apartment [place]. EMS reports he was minimally responsive to pain. Given a total of 3mg  narcan IN with no change. Initially hypotensive with EMS, given 500cc NS PTA.

## 2019-05-05 NOTE — ED Notes (Signed)
Given 2mg  narcan IV with no change.
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
## 2019-05-05 NOTE — ED Provider Notes (Signed)
.  Critical Care Performed by: Lennice Sites, DO Authorized by: Lennice Sites, DO   Critical care provider statement:    Critical care time (minutes):  45   Critical care was necessary to treat or prevent imminent or life-threatening deterioration of the following conditions:  CNS failure or compromise and respiratory failure   Critical care was time spent personally by me on the following activities:  Blood draw for specimens, development of treatment plan with patient or surrogate, discussions with primary provider, evaluation of patient's response to treatment, examination of patient, ordering and performing treatments and interventions, ordering and review of laboratory studies, ordering and review of radiographic studies, pulse oximetry and re-evaluation of patient's condition   I assumed direction of critical care for this patient from another provider in my specialty: no     Patient with history of bipolar disorder who presents to the ED in apparent overdose.  Patient with GCS of 3.  However breathing on his own.  Tachycardic.  No fever.  Normal blood pressure.  On nonrebreather.  Intubated by my resident which I supervised.  Altered mental status work-up initiated.  CT scan the head normal.  CT neck normal.  Patient had normal blood sugar.  Had no effect of Narcan in the field.  Patient has multiple prescriptions for antidepressants.  EKG showed mild widening of QRS.  Sodium bicarb was given.  Alcohol level is normal.  Lactic acid unremarkable.  Urinalysis normal.  No significant electrolyte abnormality, kidney injury.  Coronavirus test negative.  Blood gas reassuring.  Overall altered mental status likely from overdose from possibly benzodiazepines versus antidepressants.  Admitted to ICU for further care.  This chart was dictated using voice recognition software.  Despite best efforts to proofread,  errors can occur which can change the documentation meaning.     Lennice Sites,  DO 05/05/19 2343

## 2019-05-06 ENCOUNTER — Inpatient Hospital Stay (HOSPITAL_COMMUNITY): Payer: No Typology Code available for payment source

## 2019-05-06 DIAGNOSIS — T1491XA Suicide attempt, initial encounter: Secondary | ICD-10-CM

## 2019-05-06 DIAGNOSIS — G9340 Encephalopathy, unspecified: Secondary | ICD-10-CM

## 2019-05-06 DIAGNOSIS — R45851 Suicidal ideations: Secondary | ICD-10-CM

## 2019-05-06 DIAGNOSIS — R4182 Altered mental status, unspecified: Secondary | ICD-10-CM

## 2019-05-06 DIAGNOSIS — R4189 Other symptoms and signs involving cognitive functions and awareness: Secondary | ICD-10-CM

## 2019-05-06 DIAGNOSIS — T50902A Poisoning by unspecified drugs, medicaments and biological substances, intentional self-harm, initial encounter: Secondary | ICD-10-CM

## 2019-05-06 LAB — MRSA PCR SCREENING: MRSA by PCR: NEGATIVE

## 2019-05-06 LAB — RENAL FUNCTION PANEL
Albumin: 3.7 g/dL (ref 3.5–5.0)
Anion gap: 10 (ref 5–15)
BUN: 11 mg/dL (ref 6–20)
CO2: 24 mmol/L (ref 22–32)
Calcium: 8.4 mg/dL — ABNORMAL LOW (ref 8.9–10.3)
Chloride: 106 mmol/L (ref 98–111)
Creatinine, Ser: 0.96 mg/dL (ref 0.61–1.24)
GFR calc Af Amer: >60 mL/min (ref >60)
GFR calc non Af Amer: >60 mL/min (ref >60)
Glucose, Bld: 131 mg/dL — ABNORMAL HIGH (ref 70–99)
Phosphorus: 3.7 mg/dL (ref 2.5–4.6)
Potassium: 3.6 mmol/L (ref 3.5–5.1)
Sodium: 140 mmol/L (ref 135–145)

## 2019-05-06 LAB — TRICYCLICS SCREEN, URINE
TCA Scrn: POSITIVE — AB
TCA Scrn: POSITIVE — AB

## 2019-05-06 LAB — CBC
HCT: 38.3 % — ABNORMAL LOW (ref 39.0–52.0)
Hemoglobin: 13 g/dL (ref 13.0–17.0)
MCH: 32.2 pg (ref 26.0–34.0)
MCHC: 33.9 g/dL (ref 30.0–36.0)
MCV: 94.8 fL (ref 80.0–100.0)
Platelets: 235 10*3/uL (ref 150–400)
RBC: 4.04 MIL/uL — ABNORMAL LOW (ref 4.22–5.81)
RDW: 13.2 % (ref 11.5–15.5)
WBC: 11.9 10*3/uL — ABNORMAL HIGH (ref 4.0–10.5)
nRBC: 0 % (ref 0.0–0.2)

## 2019-05-06 LAB — SALICYLATE LEVEL: Salicylate Lvl: <7 mg/dL (ref 2.8–30.0)

## 2019-05-06 LAB — GLUCOSE, CAPILLARY
Glucose-Capillary: 101 mg/dL — ABNORMAL HIGH (ref 70–99)
Glucose-Capillary: 111 mg/dL — ABNORMAL HIGH (ref 70–99)
Glucose-Capillary: 94 mg/dL (ref 70–99)

## 2019-05-06 LAB — HEMOGLOBIN A1C
Hgb A1c MFr Bld: 5.2 % (ref 4.8–5.6)
Mean Plasma Glucose: 102.54 mg/dL

## 2019-05-06 LAB — TRIGLYCERIDES: Triglycerides: 209 mg/dL — ABNORMAL HIGH (ref <150)

## 2019-05-06 LAB — MAGNESIUM: Magnesium: 1.9 mg/dL (ref 1.7–2.4)

## 2019-05-06 LAB — ACETAMINOPHEN LEVEL: Acetaminophen (Tylenol), Serum: <10 ug/mL — ABNORMAL LOW (ref 10–30)

## 2019-05-06 MED ORDER — CHLORHEXIDINE GLUCONATE 0.12% ORAL RINSE (MEDLINE KIT)
15.0000 mL | Freq: Two times a day (BID) | OROMUCOSAL | Status: DC
  Administered 2019-05-06 (×2): 15 mL via OROMUCOSAL

## 2019-05-06 MED ORDER — ORAL CARE MOUTH RINSE
15.0000 mL | OROMUCOSAL | Status: DC
  Administered 2019-05-06 (×3): 15 mL via OROMUCOSAL

## 2019-05-06 MED ORDER — LORAZEPAM 2 MG/ML IJ SOLN
1.0000 mg | INTRAMUSCULAR | Status: DC | PRN
  Administered 2019-05-06 – 2019-05-11 (×7): 1 mg via INTRAVENOUS
  Filled 2019-05-06 (×7): qty 1

## 2019-05-06 MED ORDER — CALCIUM GLUCONATE-NACL 1-0.675 GM/50ML-% IV SOLN
1.0000 g | Freq: Once | INTRAVENOUS | Status: AC
  Administered 2019-05-06: 1000 mg via INTRAVENOUS
  Filled 2019-05-06: qty 50

## 2019-05-06 MED ORDER — INSULIN ASPART 100 UNIT/ML ~~LOC~~ SOLN: 0.0000 [IU] | SUBCUTANEOUS | Status: DC

## 2019-05-06 MED ORDER — MAGNESIUM SULFATE 2 GM/50ML IV SOLN
2.0000 g | Freq: Once | INTRAVENOUS | Status: AC
  Administered 2019-05-06: 2 g via INTRAVENOUS
  Filled 2019-05-06: qty 50

## 2019-05-06 MED ORDER — CHLORHEXIDINE GLUCONATE CLOTH 2 % EX PADS: 6.0000 | MEDICATED_PAD | Freq: Every day | CUTANEOUS | Status: DC

## 2019-05-06 MED ORDER — POTASSIUM CHLORIDE 20 MEQ PO PACK: 40.0000 meq | PACK | Freq: Two times a day (BID) | ORAL | Status: DC
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
## 2019-05-06 NOTE — Progress Notes (Addendum)
NAME:  Mario Cameron, MRN:  [id number], DOB:  1970/11/15, LOS: 1 ADMISSION DATE:  05/05/2019, CONSULTATION DATE:  05/06/19 REFERRING MD:  ED, CHIEF COMPLAINT:  AMS  Brief History   48 year old male PMH bipolar disorder, anxiety, depression, GERD, migraines, gastric sleeve surgery (remote), recently admitted with palpitations, presyncope and lightheadedness dx with atrial flutter, brought to  ED after being found unresponsive in his apartment complex.   History of present illness   Initially was minimally responsive to pain when examined by EMS. No response to narcan 3mg  by EMS, 500 mg NS for hypotension. Initially in ED 7pm.   Given additional 2mg  narcan in ED - no change.  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    Per them, he wrote notes discussing his end of life wishes if he were to die or be unable to make decisions due to complications from upcoming cardiac procedure.    He was recently started on diltiazem and xarelto earlier this month, then on 6/23 started flecanide prn for ongoing palpitations.     Significant Hospital Events   6/29: intubated  Consults:    Procedures:    Significant Diagnostic Tests:  Initial lactate 3.1 --> 1.8 Trop neg Acetaminophen neg Salicylate neg TSH nl  SARS Cov2 neg  CT head, cervical spine:  IMPRESSION: 1. Stable and normal noncontrast CT appearance of the brain. 2. No acute traumatic injury identified in the cervical spine. 3. Visible ET tube and enteric tube appear well positioned. 4. There is some fluid in the thoracic esophagus. Mild atelectasis in the lung apices.   Echo: 6/12 1. The left ventricle has normal systolic function with an ejection fraction of 60-65%. The cavity size was normal. There is moderate asymmetric left ventricular  hypertrophy. Left ventricular diastolic function could not be evaluated secondary to atrial  fibrillation.  2. The right ventricle has normal systolic function. The cavity was normal. There is no increase in right ventricular wall thickness.  3. No evidence of mitral valve stenosis.  4. No stenosis of the aortic valve.  5. The aortic root and ascending aorta are normal in size and structure.  6. The interatrial septum was not assessed.  6/30 EKG QRS 92 Qt/Qtc 586/732 Micro Data:  Bl Cx 6/29>>NGTD  Antimicrobials:    Interim history/subjective:  6/30: No acute events overnight. Sedated on propofol   Objective   Blood pressure 99/63, pulse 90, temperature 98.2 F (36.8 C), resp. rate 12, height 6' (1.829 m), weight 100 kg, SpO2 99 %.    Vent Mode: PRVC FiO2 (%):  [40 %-100 %] 40 % Set Rate:  [15 bmp] 15 bmp Vt Set:  [620 mL] 620 mL PEEP:  [5 cmH20] 5 cmH20 Plateau Pressure:  [15 cmH20-20 cmH20] 15 cmH20   Intake/Output Summary (Last 24 hours) at 05/06/2019 0912 Last data filed at 05/06/2019 0541 Gross per 24 hour  Intake 28.77 ml  Output 350 ml  Net -321.23 ml   Filed Weights   05/05/19 2219  Weight: 100 kg    Examination: General: well developed, well nourished, NAD HENT: Normocephalic. PERRL [id number] [id number] Ileene18m HIleen57me [id number] HEnviroConcern.sint, clear, FNL, symmetrical. Vent supported Cardiovascular: RRR. S1S2. No MRG  Abdomen: + BX s4, SNT/ND Extremities: no edema. +2 distal pulses Neuro: withdraws x4. Does  not follow commands.  Skin: PWD. Sternal area raised erythematous rash    Resolved Hospital Problem list     Assessment & Plan:  AMS Possibly 2/2 intentional overdose vs drug interaction.  -head CT no acute process -UDS + TCA -QRS <94 -Qt/Qtc prolonged 586/732 P: -neuro checks -RASS goal 0  -WUA with goal for early extubation  -encourage sleep wake cycle -consider EEG -poison control following  -continue supportive care.  -continue to monitor EKG  -pharmacy consulted for medication interactions -correct electrolytes -repeat salicylate. Acetaminophen level  -am CMP, mag  -will need psyche eval once medically stable  Acute respiratory failure 2/2 above  P: -continue mechanical ventilation with goal for early vent liberation -RT/pulm hygiene   Hypothermia -transient. Pt now normothermic.  -minimal leukocytosis P: -Cultures NGTD -monitor WBC, fever curve, Sn/Sx infection  H/o A-flutter/Afib with intermittent Tachycardia -currently SR P: -continue to monitor.    Best practice:  Diet: NPO for now-start TF if unable to liberate from bent  Pain/Anxiety/Delirium protocol (if indicated): propofol, prn analgesia VAP protocol (if indicated):  DVT prophylaxis:  Heparin TID  GI prophylaxis: protonix 40 daily Glucose control:  Mobility: OOB when able, up in chair when able  Code Status: Full  Family Communication: will update Disposition: continue ICU Labs   CBC: Recent Labs  Lab 05/05/19 2009 05/05/19 2056 05/05/19 2128 05/06/19 0150  WBC 8.3  --   --  11.9*  NEUTROABS 5.9  --   --   --   HGB 13.3 11.9* 11.9* 13.0  HCT 40.7 35.0* 35.0* 38.3*  MCV 97.8  --   --  94.8  PLT 243  --   --  381    Basic Metabolic Panel: Recent Labs  Lab 05/05/19 2009 05/05/19 2056 05/05/19 2128 05/06/19 0150  NA 135 140 139 140  K 3.1* 3.4* 3.6 3.6  CL 103  --   --  106  CO2 21*  --   --  24  GLUCOSE 145*  --   --  131*  BUN 10  --   --  11  CREATININE 1.04  --   --  0.96  CALCIUM 8.6*  --   --  8.4*  MG  --   --   --  1.9  PHOS  --   --   --  3.7   GFR: Estimated Creatinine Clearance: 119 mL/min (by C-G formula based on SCr of 0.96 mg/dL). Recent Labs  Lab 05/05/19 2009 05/05/19 2224 05/06/19 0150  WBC 8.3  --  11.9*  LATICACIDVEN 3.1* 1.8  --     Liver Function Tests: Recent Labs  Lab 05/05/19 2009 05/06/19 0150  AST 17  --   ALT 17  --   ALKPHOS 69  --   BILITOT 0.5  --   PROT 6.3*  --   ALBUMIN 3.9  3.7   No results for input(s): LIPASE, AMYLASE in the last 168 hours. Recent Labs  Lab 05/05/19 2011  AMMONIA 18    ABG    Component Value Date/Time   PHART 7.366 05/05/2019 2128   PCO2ART 41.1 05/05/2019 2128   PO2ART 287.0 (H) 05/05/2019 2128   HCO3 23.5 05/05/2019 2128   TCO2 25 05/05/2019 2128   ACIDBASEDEF 2.0 05/05/2019 2128   O2SAT 100.0 05/05/2019 2128     Coagulation Profile: No results for input(s): INR, PROTIME in the last 168 hours.  Cardiac Enzymes: No results for input(s): CKTOTAL, CKMB, CKMBINDEX, TROPONINI in the last 168  hours.  HbA1C: No results found for: HGBA1C  CBG: Recent Labs  Lab 05/05/19 1951  GLUCAP 134*    I spent 40 minutes in direct patient care including reviewing data,  discussing with other providers, assessment, planning and stabilization and documentation. Time is exclusive to this patient and does not include procedures.   Karin Lieuhonda Mairi Stagliano, MSN, AGACNP  [facility]

## 2019-05-06 NOTE — Progress Notes (Signed)
Upon pt belongings assessment, this RN found a notepad with instructions for family in the event of completed suicide. Along with this notepad, pts advance directives and living will, a picture frame, green fabric, shoes, glasses, wallet, and keys.  MD notified of this.

## 2019-05-06 NOTE — Consult Note (Signed)
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 Activity:  Normal  Concentration:  Concentration: Good and Attention Span: Good  Recall:  Good  Fund of Knowledge:  Good  Language:  Good  Akathisia:  No  Handed:  Right  AIMS (if indicated):   N/A  Assets:  Communication Skills Desire for Improvement Housing Resilience Social Support  ADL's:  Intact  Cognition:  WNL  Sleep:   N/A   Assessment:  Mario Cameron is a 48 y.o. male who was admitted with  altered mental status. Patient admits to a suicide attempt by drug overdose of Amitriptyline and Ambien in the setting of multiple psychosocial stressors. He denies current SI, HI or AVH. He warrants inpatient psychiatric hospitalization for stabilization and treatment.   Treatment Plan Summary: -Patient warrants inpatient psychiatric hospitalization given high risk of harm to self. -Continue bedside sitter.  -Continue to hold home psychotropic medications given recent overdose. Would resume Lamictal within 3-4 days to prevent having to titrate from starting dose. Recommend restarting after patient's mental status improves.  -EKG reviewed and QTc 477 (down from 583). Please closely monitor when starting or increasing QTc prolonging agents.  -Please pursue involuntary commitment if patient refuses voluntary psychiatric hospitalization or attempts to leave the hospital.  -Will sign off on patient at this time. Please consult psychiatry again as needed.    Disposition: Recommend psychiatric Inpatient admission when medically cleared.  This service was provided via telemedicine using a 2-way, interactive audio and video technology.  Names of all persons participating in this telemedicine service and their role in this encounter. Name: Buford Dresser, DO Role: Psychiatrist  Name: [facility]  Role: Patient    Faythe Dingwall, DO 05/06/2019 2:45 PM

## 2019-05-06 NOTE — Consult Note (Addendum)
NAMEOrhan Cameron, MRN:  [id number], DOB:  [birth year], LOS: 1 ADMISSION DATE:  05/05/2019, CONSULTATION DATE:  05/06/19 REFERRING MD:  ED, CHIEF COMPLAINT:  AMS  Brief History   48 year old man with a history of bipolar disorder, anxiety, depression, GERD, migraines, gastric sleeve surgery (remote), recently admitted with palpitations, presyncope and lightheadedness dx with atrial flutter, brought to  ED after being found unresponsive in his apartment complex.   History of present illness   Initially was minimally responsive to pain when examined by EMS. No response to narcan 3mg  by EMS, 500 mg NS for hypotension. Initially in ED 7pm.   Given additional 2mg  narcan in ED - no change.  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    Per them, he wrote notes discussing his end of life wishes if he were to die or be unable to make decisions due to complications from upcoming cardiac procedure.    He was recently started on diltiazem and xarelto earlier this month, on 6/23 started flecanide prn for ongoing palpitations.    Past Medical History  bipolar disorder, anxiety, depression, GERD, migraines, gastric sleeve surgery (remote), recently (6/11) admitted with palpitations, presyncope and lightheadedness dx with atrial flutter (started on Eliquis,s witched to rivaroxaban). Hx of varicella ("chicken pox") HSV (?)  Meds; amitriptyline 50mg  q HS, lipitor 10mg  daily, lamotrigine 400 in am, 200 in pm.  prilosec 20mg  daily, effexor 300 daily, ambien 10mg  qhs Buspar 20mg  qhs prn anxiety Diltiazem 240 daily (new) Prazosin 5mg  qhs  flecanide 100mg  as needed (new) [place] [facility] Events   6/29: intubated  Consults:    Procedures:    Significant Diagnostic Tests:  Initial lactate 3.1 --> 1.8  Trop neg Acetaminophen neg Salicylate neg TSH nl  SARS Cov2 neg  CT head, cervical spine:  IMPRESSION: 1. Stable and normal noncontrast CT appearance of the brain. 2. No acute traumatic injury identified in the cervical spine. 3. Visible ET tube and enteric tube appear well positioned. 4. There is some fluid in the thoracic esophagus. Mild atelectasis in the lung apices.   Echo: 6/12 1. The left ventricle has normal systolic function with an ejection fraction of 60-65%. The cavity size was normal. There is moderate asymmetric left ventricular hypertrophy. Left ventricular diastolic function could not be evaluated secondary to atrial  fibrillation.  2. The right ventricle has normal systolic function. The cavity was normal. There is no increase in right ventricular wall thickness.  3. No evidence of mitral valve stenosis.  4. No stenosis of the aortic valve.  5. The aortic root and ascending aorta are normal in size and structure.  6. The interatrial septum was not assessed.  Micro Data:  Bl Cx 6/29 pending  Antimicrobials:    Interim history/subjective:  Unable to assess subjective  Objective   Blood pressure (!) 100/59, pulse (!) 106, temperature (!) 97.2 F (36.2 C), temperature source Rectal, resp. rate 15, height 6' (1.829 m), weight 100 kg, SpO2 97 %.    Vent Mode: PRVC FiO2 (%):  [60 %-100 %] 60 % Set Rate:  [15 bmp] 15 bmp Vt Set:  [620 mL] 620 mL PEEP:  [5 cmH20] 5 cmH20 Plateau Pressure:  [18 cmH20] 18 cmH20  No intake or output data in the  24 hours ending 05/06/19 0015 Filed Weights   05/05/19 2219  Weight: 100 kg    Examination: General: NAD, Withdraws to pain in all 4 HENT: NCAT, pupils 3cm errl.  Lungs: CTAB Cardiovascular: sinus tachy? No mGR Abdomen: NT, ND, NBS Extremities: no edema, nl.   Neuro: only responsive to painful stimuli (now on propofol low dose).  Moving spontaneously (intermittently)    Resolved Hospital Problem list      Assessment & Plan:  AMS - initial GCS 1,1,4  Intubated started propofol  Given sodium bicarb at 2200, 50 meq  Given etomidate and rocuronium at 8pm   Possibly 2/2 intentional overdose vs drug interaction.  Discussed with poison control .  Supportive care.  Flecanide, amitryptilline can widen qrs.  If QRS  Over 140 repeat NAHCO3 bolus.   Repeat EKG q 4.   2. Hypothermia (95.9) Cultures pending.  Unclear cause.   3. Tachycardia Monitor.  Does not appear to be in flutter at this time.   Best practice:  Diet: NPO for now Pain/Anxiety/Delirium protocol (if indicated): on propofol now VAP protocol (if indicated):  DVT prophylaxis:  Heparin TID  GI prophylaxis: protonix 40 daily Glucose control:  Mobility: OOB when able, up in chair when able  Code Status: Full  Family Communication: Spoke with his dad, [name], and step mom (?) Disposition:  ICU Labs   CBC: Recent Labs  Lab 05/05/19 2009 05/05/19 2056 05/05/19 2128  WBC 8.3  --   --   NEUTROABS 5.9  --   --   HGB 13.3 11.9* 11.9*  HCT 40.7 35.0* 35.0*  MCV 97.8  --   --   PLT 243  --   --     Basic Metabolic Panel: Recent Labs  Lab 05/05/19 2009 05/05/19 2056 05/05/19 2128  NA 135 140 139  K 3.1* 3.4* 3.6  CL 103  --   --   CO2 21*  --   --   GLUCOSE 145*  --   --   BUN 10  --   --   CREATININE 1.04  --   --   CALCIUM 8.6*  --   --    GFR: Estimated Creatinine Clearance: 109.9 mL/min (by C-G formula based on SCr of 1.04 mg/dL). Recent Labs  Lab 05/05/19 2009 05/05/19 2224  WBC 8.3  --   LATICACIDVEN 3.1* 1.8    Liver Function Tests: Recent Labs  Lab 05/05/19 2009  AST 17  ALT 17  ALKPHOS 69  BILITOT 0.5  PROT 6.3*  ALBUMIN 3.9   No results for input(s): LIPASE, AMYLASE in the last 168 hours. Recent Labs  Lab 05/05/19 2011  AMMONIA 18    ABG    Component Value Date/Time   PHART 7.366 05/05/2019 2128   PCO2ART 41.1 05/05/2019 2128   PO2ART 287.0 (H) 05/05/2019 2128   HCO3 23.5  05/05/2019 2128   TCO2 25 05/05/2019 2128   ACIDBASEDEF 2.0 05/05/2019 2128   O2SAT 100.0 05/05/2019 2128     Coagulation Profile: No results for input(s): INR, PROTIME in the last 168 hours.  Cardiac Enzymes: No results for input(s): CKTOTAL, CKMB, CKMBINDEX, TROPONINI in the last 168 hours.  HbA1C: No results found for: HGBA1C  CBG: Recent Labs  Lab 05/05/19 1951  GLUCAP 134*    Review of Systems:   Unable to assess  Past Medical History  He,  has a past medical history of Anxiety, Bipolar II disorder (HCC), Depression, GERD (gastroesophageal reflux disease), History  of chicken pox, and Migraines (11/10/2018).   Surgical History    Past Surgical History:  Procedure Laterality Date  . BARIATRIC SURGERY  10/14/2014   sleep; highest weight was 276 lb  . SHOULDER ARTHROSCOPY W/ ROTATOR CUFF REPAIR Right 10/14/2016   work-related injury; R-handed     Social History   reports that he has never smoked. He has never used smokeless tobacco. He reports previous alcohol use. He reports that he does not use drugs.   Family History   His family history is negative for Colon cancer and Prostate cancer.   Allergies Allergies  Allergen Reactions  . Ibuprofen   . Nsaids     bleeding  . Wool Alcohol [Lanolin]     sherpa wool     Home Medications  Prior to Admission medications   Medication Sig Start Date End Date Taking? Authorizing Provider  amitriptyline (ELAVIL) 100 MG tablet Take 100 mg by mouth at bedtime. 03/09/19   [provider]  atorvastatin (LIPITOR) 10 MG tablet Take 10 mg by mouth daily.    [provider]  B Complex Vitamins (B COMPLEX PO) Take 1 tablet by mouth daily. 10000 IU    [provider]  busPIRone (BUSPAR) 10 MG tablet Take 20 mg by mouth at bedtime as needed (sleep). for anxiety 03/17/19   [provider]  CALCIUM PO Take 1 tablet by mouth daily.     [provider]  Coenzyme Q10 (CO Q10 PO) Take by  mouth.    [provider]  diltiazem (CARDIZEM CD) 240 MG 24 hr capsule Take 1 capsule (240 mg total) by mouth daily for 30 days. 04/19/19 05/19/19  Briant CedarEzenduka, Nkeiruka J, MD  flecainide (TAMBOCOR) 100 MG tablet Take one tablet as needed for palpitations.  Up to maximum of 3 tablets in 24 hours. 04/29/19   Marinus Mawaylor, Gregg W, MD  IRON PO Take 65 mg by mouth daily.     [provider]  lamoTRIgine (LAMICTAL) 100 MG tablet Take 200 mg by mouth daily.  03/03/19   [provider]  Misc Natural Products (TART CHERRY ADVANCED PO) Take by mouth.    [provider]  omeprazole (PRILOSEC) 20 MG capsule Take 2 capsules (40 mg total) by mouth daily. 04/18/19   Briant CedarEzenduka, Nkeiruka J, MD  Potassium 75 MG TABS Take by mouth.    [provider]  prazosin (MINIPRESS) 5 MG capsule Take 5 mg by mouth at bedtime. 03/18/19   [provider]  sodium chloride (AFRIN SALINE NASAL MIST) 0.65 % nasal spray Place 1 spray into the nose as needed for congestion.    [provider]  tretinoin (RETIN-A) 0.1 % cream Apply 1 application topically at bedtime.    [provider]  Turmeric 500 MG CAPS Take 1 capsule by mouth daily.    [provider]  venlafaxine XR (EFFEXOR-XR) 150 MG 24 hr capsule Take 300 mg by mouth daily with breakfast.    [provider]  VITAMIN D PO Take 5,000 tablets by mouth daily. Pt takes 10,000 IU daily    [provider]  zolpidem (AMBIEN) 5 MG tablet Take 5 mg by mouth at bedtime as needed for sleep.     [provider]     Critical care time: 7060    Notes found indicating plans to end life.  I spoke with his father prior to my encountering his notes, discussed with him possibility of overdose vs drug interactions.  Cont suppportive care and close monitoring.

## 2019-05-06 NOTE — Progress Notes (Signed)
eLink Physician-Brief Progress Note Patient Name: Obert Espindola DOB: 07-30-1971 MRN: [id number]   Date of Service  05/06/2019  HPI/Events of Note  48 yr old male accepted to ICU. 1). Encephalopathy/low GCS intubated in ED. Possible OD on his anti depressants. Tox neg. etoh neg. covid neg. Received a dose of bicarb for qrs widening. Poison control aware. Home meds seen. CT he neg. 2) recently seen by Dr Brynda Peon for P atrial arythmia. Flecainide/diltiazem. Could cause prolonged qtc and qrs. Hold for now. On amytryptalline at home.   Camera: Sedated. 620/03/21/39%. Stable VS. QRS normal at 103.   Data: Reviewed. resp acidosis on ABG mild. QRS 132, qtc improved on EKG.   eICU Interventions  - continue care - follow EKG q 4 hr - follow electrolytes closely - bicarb prn if qrs widens - wean VT as tolerated - on VAP bundle - CBG goal < 180 - consider Psych eval once stable  Earlier discussed with NP.     Intervention Category Major Interventions: Other:;Respiratory failure - evaluation and management;Change in mental status - evaluation and management Evaluation Type: New Patient Evaluation  Elmer Sow 05/06/2019, 1:24 AM

## 2019-05-06 NOTE — ED Notes (Signed)
C-collar removed per Dr. Ronnald Nian.

## 2019-05-06 NOTE — Procedures (Signed)
Extubation Procedure Note  Patient Details:   Name: Mario Cameron DOB: [birth year] MRN: [id number]   Airway Documentation:    Vent end date: 05/06/19 Vent end time: 1023   Evaluation  O2 sats: stable throughout Complications: No apparent complications Patient did tolerate procedure well. Bilateral Breath Sounds: Diminished, Clear   Yes   Patient extubated to 4L [place] per order. Positive cuff leak noted. No stridor. RN and MD at bedside. Patient tolerating well. RT will continue to monitor.  Herbie Baltimore 05/06/2019, 10:44 AM

## 2019-05-07 DIAGNOSIS — T1491XA Suicide attempt, initial encounter: Secondary | ICD-10-CM

## 2019-05-07 LAB — CBC
HCT: 36.2 % — ABNORMAL LOW (ref 39.0–52.0)
Hemoglobin: 12.1 g/dL — ABNORMAL LOW (ref 13.0–17.0)
MCH: 32 pg (ref 26.0–34.0)
MCHC: 33.4 g/dL (ref 30.0–36.0)
MCV: 95.8 fL (ref 80.0–100.0)
Platelets: 216 10*3/uL (ref 150–400)
RBC: 3.78 MIL/uL — ABNORMAL LOW (ref 4.22–5.81)
RDW: 13.3 % (ref 11.5–15.5)
WBC: 8.8 10*3/uL (ref 4.0–10.5)
nRBC: 0 % (ref 0.0–0.2)

## 2019-05-07 LAB — COMPREHENSIVE METABOLIC PANEL
ALT: 15 U/L (ref 0–44)
AST: 14 U/L — ABNORMAL LOW (ref 15–41)
Albumin: 3.5 g/dL (ref 3.5–5.0)
Alkaline Phosphatase: 67 U/L (ref 38–126)
Anion gap: 8 (ref 5–15)
BUN: 10 mg/dL (ref 6–20)
CO2: 24 mmol/L (ref 22–32)
Calcium: 8.7 mg/dL — ABNORMAL LOW (ref 8.9–10.3)
Chloride: 109 mmol/L (ref 98–111)
Creatinine, Ser: 0.85 mg/dL (ref 0.61–1.24)
GFR calc Af Amer: >60 mL/min (ref >60)
GFR calc non Af Amer: >60 mL/min (ref >60)
Glucose, Bld: 114 mg/dL — ABNORMAL HIGH (ref 70–99)
Potassium: 3.4 mmol/L — ABNORMAL LOW (ref 3.5–5.1)
Sodium: 141 mmol/L (ref 135–145)
Total Bilirubin: 0.8 mg/dL (ref 0.3–1.2)
Total Protein: 6 g/dL — ABNORMAL LOW (ref 6.5–8.1)

## 2019-05-07 LAB — GLUCOSE, CAPILLARY
Glucose-Capillary: 107 mg/dL — ABNORMAL HIGH (ref 70–99)
Glucose-Capillary: 102 mg/dL — ABNORMAL HIGH (ref 70–99)
Glucose-Capillary: 105 mg/dL — ABNORMAL HIGH (ref 70–99)
Glucose-Capillary: 92 mg/dL (ref 70–99)
Glucose-Capillary: 99 mg/dL (ref 70–99)

## 2019-05-07 LAB — MAGNESIUM: Magnesium: 2.1 mg/dL (ref 1.7–2.4)

## 2019-05-07 LAB — FLECAINIDE LEVEL: Flecainide: <0.1 ug/mL — ABNORMAL LOW (ref 0.20–1.00)

## 2019-05-07 MED ORDER — POTASSIUM CHLORIDE CRYS ER 20 MEQ PO TBCR
40.0000 meq | EXTENDED_RELEASE_TABLET | Freq: Once | ORAL | Status: AC
  Administered 2019-05-07: 40 meq via ORAL
  Filled 2019-05-07: qty 2

## 2019-05-07 MED ORDER — ACETAMINOPHEN 325 MG PO TABS
650.0000 mg | ORAL_TABLET | ORAL | Status: DC | PRN
  Administered 2019-05-07 – 2019-05-12 (×4): 650 mg via ORAL
  Filled 2019-05-07 (×5): qty 2

## 2019-05-07 NOTE — Progress Notes (Signed)
CSW received consult regarding Inpatient psych placement. [place] and [place] do not have beds available today but will review referral for tomorrow. CSW faxed referral to [place] and [place].  Percell Locus Fritzi Scripter LCSW [phone]

## 2019-05-07 NOTE — Progress Notes (Signed)
Patient's mother updated on patient's status and plan of care; questions answered. Will continue to monitor.  Hiram Comber, RN 05/07/2019 4:59 PM

## 2019-05-07 NOTE — Progress Notes (Signed)
NAMErskine Emery:  Mario Cameron, MRN:  [id number], DOB:  September 01, 1971, LOS: 2 ADMISSION DATE:  05/05/2019, CONSULTATION DATE:  05/06/19 REFERRING MD:  ED, CHIEF COMPLAINT:  AMS  Brief History   48 year old male PMH bipolar disorder, anxiety, depression, GERD, migraines, gastric sleeve surgery (remote), recently admitted with palpitations, presyncope and lightheadedness dx with atrial flutter, brought to  ED after being found unresponsive in his apartment complex.   History of present illness   Initially was minimally responsive to pain when examined by EMS. No response to narcan 3mg  by EMS, 500 mg NS for hypotension. Initially in ED 7pm.   Given additional 2mg  narcan in ED - no change.  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  Per them, he wrote notes discussing his end of life wishes if he were to die or be unable to make decisions due to complications from upcoming cardiac procedure.    He was recently started on diltiazem and xarelto earlier this month (xarelto d/c after 2 weeks per EP), then on 6/23 started flecanide prn for ongoing palpitations.    Of note, during admission, upon RN belongings assessment, notepad with instructions for family in the event of completed suicide was found along with advanced directives and living will.  Psych eval completed 6/30 where pt did admit to SI by intentional overdose.   Significant Hospital Events   6/29 Admit, intubated 6/30 Extubated  Consults:  Psych  Procedures:  ETT 6/29 > 6/30  Significant Diagnostic Tests:  6/29 CT Head, Neck, Spine: Stable and normal noncontrast CT appearance of the brain. No acute traumatic injury identified in the cervical spine. Visible ET tube and enteric tube appear well positioned. There is some fluid in the thoracic esophagus. Mild  atelectasis in the lung apices.  Micro Data:  Bl Cx 6/29>>NGTD  Antimicrobials:    Interim history/subjective:  Psych eval 6/30 and recommending inpatient psych admission. [facility] but likely tomorrow 7/2  Objective   Blood pressure 119/77, pulse 99, temperature 99.1 F (37.3 C), temperature source Oral, resp. rate 18, height 6' (1.829 m), weight 100 kg, SpO2 97 %.        Intake/Output Summary (Last 24 hours) at 05/07/2019 1153 Last data filed at 05/07/2019 1002 Gross per 24 hour  Intake 336.32 ml  Output 615 ml  Net -278.68 ml   Filed Weights   05/05/19 2219  Weight: 100 kg    Examination: General: Adult male, in NAD HENT: Normocephalic. MMM Lungs: CTAB Cardiovascular: RRR. No MRG  Abdomen: + BX s4, SNT/ND Extremities: no edema. +2 distal pulses Neuro: Somnolent and intermittently confused.  Does arouse to voice and answer basic questions appropriately.  MAE's Skin: warm, dry.   Assessment & Plan:   AMS 2/2 intentional overdose (amitriptyline and ambien).  Head CT neg, UDS + TCA. Suicide attempt - evaluated by psych who recommends inpatient psych admission. - inpatient psych admission once bed available ([facility]) - Hold all psychotropic meds until mental status improves and is [facility].  H/o atrial arrhythmias (was on xarelto for 2 weeks but OK to discontinue per EP), HTN, HLD. - Hold preadmission atorvastatin, diltiazem, prazosin, flecainide PRN.  Hypokalemia. - 40mEq K now. - Follow BMP.  Best practice:  Diet: regular. Pain/Anxiety/Delirium protocol (if indicated): N/A. VAP protocol (if indicated): N/A. DVT prophylaxis:  Heparin TID  GI prophylaxis: protonix 40 daily Glucose control: N/A. Mobility: OOB when able, up in chair when able  Code Status: Full  Family Communication: will update Disposition: Tele.  Discharge to [facility] tomorrow 7/2 (if bed available at other psych  facility today 7/1, then OK to discharge there).   Montey Hora, [place] [facility] Pager: [phone].  If no answer, [phone] 05/07/2019, 12:04 PM

## 2019-05-08 LAB — BASIC METABOLIC PANEL
Anion gap: 8 (ref 5–15)
BUN: 11 mg/dL (ref 6–20)
CO2: 25 mmol/L (ref 22–32)
Calcium: 8.6 mg/dL — ABNORMAL LOW (ref 8.9–10.3)
Chloride: 109 mmol/L (ref 98–111)
Creatinine, Ser: 0.92 mg/dL (ref 0.61–1.24)
GFR calc Af Amer: >60 mL/min (ref >60)
GFR calc non Af Amer: >60 mL/min (ref >60)
Glucose, Bld: 100 mg/dL — ABNORMAL HIGH (ref 70–99)
Potassium: 4.3 mmol/L (ref 3.5–5.1)
Sodium: 142 mmol/L (ref 135–145)

## 2019-05-08 LAB — GLUCOSE, CAPILLARY
Glucose-Capillary: 144 mg/dL — ABNORMAL HIGH (ref 70–99)
Glucose-Capillary: 96 mg/dL (ref 70–99)
Glucose-Capillary: 99 mg/dL (ref 70–99)
Glucose-Capillary: 91 mg/dL (ref 70–99)

## 2019-05-08 LAB — CBC
HCT: 36.9 % — ABNORMAL LOW (ref 39.0–52.0)
Hemoglobin: 12.1 g/dL — ABNORMAL LOW (ref 13.0–17.0)
MCH: 31.6 pg (ref 26.0–34.0)
MCHC: 32.8 g/dL (ref 30.0–36.0)
MCV: 96.3 fL (ref 80.0–100.0)
Platelets: 217 10*3/uL (ref 150–400)
RBC: 3.83 MIL/uL — ABNORMAL LOW (ref 4.22–5.81)
RDW: 13.2 % (ref 11.5–15.5)
WBC: 6.1 10*3/uL (ref 4.0–10.5)
nRBC: 0 % (ref 0.0–0.2)

## 2019-05-08 MED ORDER — PANTOPRAZOLE SODIUM 20 MG PO TBEC: 20.0000 mg | DELAYED_RELEASE_TABLET | Freq: Two times a day (BID) | ORAL | 0 refills | Status: DC

## 2019-05-08 MED ORDER — PANTOPRAZOLE SODIUM 40 MG PO TBEC
40.0000 mg | DELAYED_RELEASE_TABLET | Freq: Every day | ORAL | Status: DC
  Administered 2019-05-08: 40 mg via ORAL
  Filled 2019-05-08: qty 1

## 2019-05-08 MED ORDER — PANTOPRAZOLE SODIUM 20 MG PO TBEC
20.0000 mg | DELAYED_RELEASE_TABLET | Freq: Two times a day (BID) | ORAL | Status: DC
  Administered 2019-05-08 – 2019-05-09 (×3): 20 mg via ORAL
  Filled 2019-05-08 (×4): qty 1

## 2019-05-08 NOTE — Progress Notes (Signed)
New rash and non-pitting edema noticed on patient's left hand; no complaints of pain or itching. MD made aware. Will continue to monitor.

## 2019-05-08 NOTE — Progress Notes (Signed)
[facility] do not have beds available yet. Patient is on the waitlist at [facility]. Still under review at [facility]. CSW will expand search.   Percell Locus Hope Brandenburger LCSW [phone]

## 2019-05-08 NOTE — Progress Notes (Addendum)
NAMErskine Emery:  Harless Rodkey, MRN:  [id number], DOB:  [birth year], LOS: 3 ADMISSION DATE:  05/05/2019, CONSULTATION DATE:  05/06/19 REFERRING MD:  ED, CHIEF COMPLAINT:  AMS  Brief History   48 year old male PMH bipolar disorder, anxiety, depression, GERD, migraines, gastric sleeve surgery (remote), recently admitted with palpitations, presyncope and lightheadedness dx with atrial flutter, brought to  ED after being found unresponsive in his apartment complex.   History of present illness   Initially was minimally responsive to pain when examined by EMS. No response to narcan 3mg  by EMS, 500 mg NS for hypotension. Initially in ED 7pm.   Given additional 2mg  narcan in ED - no change.  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  Per them, he wrote notes discussing his end of life wishes if he were to die or be unable to make decisions due to complications from upcoming cardiac procedure.    He was recently started on diltiazem and xarelto earlier this month (xarelto d/c after 2 weeks per EP), then on 6/23 started flecanide prn for ongoing palpitations.    Of note, during admission, upon RN belongings assessment, notepad with instructions for family in the event of completed suicide was found along with advanced directives and living will.  Psych eval completed 6/30 where pt did admit to SI by intentional overdose.  Significant Hospital Events   6/29 Admit, intubated 6/30 Extubated  Consults:  Psych  Procedures:  ETT 6/29 > 6/30  Significant Diagnostic Tests:  6/29 CT Head, Neck, Spine: Stable and normal noncontrast CT appearance of the brain. No acute traumatic injury identified in the cervical spine. Visible ET tube and enteric tube appear well positioned. There is some fluid in the thoracic esophagus. Mild  atelectasis in the lung apices.  Micro Data:  Bl Cx 6/29>>NGTD  Antimicrobials:    Interim history/subjective:  No acute events.  [facility]  Objective   Blood pressure (!) 125/99, pulse 87, temperature 98.1 F (36.7 C), temperature source Oral, resp. rate (!) 22, height 6' (1.829 m), weight 100 kg, SpO2 99 %.        Intake/Output Summary (Last 24 hours) at 05/08/2019 1646 Last data filed at 05/08/2019 1436 Gross per 24 hour  Intake 512 ml  Output -  Net 512 ml   Filed Weights   05/05/19 2219  Weight: 100 kg    Examination: Gen:      No acute distress HEENT:  EOMI, sclera anicteric Neck:     No masses; no thyromegaly Lungs:    Clear to auscultation bilaterally; normal respiratory effort CV:         Regular rate and rhythm; no murmurs Abd:      + bowel sounds; soft, non-tender; no palpable masses, no distension Ext:    No edema; adequate peripheral perfusion Skin:      Warm and dry; no rash Neuro: alert and oriented x 3 Psych: normal mood and affect  Assessment & Plan:   AMS 2/2 intentional overdose (amitriptyline and ambien).  Head CT neg, UDS + TCA. Suicide attempt - evaluated by psych who recommends inpatient psych admission. - inpatient psych admission once bed available ([facility]) - Hold all psychotropic meds  H/o atrial arrhythmias (was on  xarelto for 2 weeks but OK to discontinue per EP), HTN, HLD. - Hold preadmission atorvastatin, diltiazem, prazosin, flecainide PRN.  TRH to pick up tomorrow. Signed out to Dr. Christen Bame practice:  Diet: regular. Pain/Anxiety/Delirium protocol (if indicated): N/A. VAP protocol (if indicated): N/A. DVT prophylaxis:  Heparin TID  GI prophylaxis: protonix 40 daily Glucose control: N/A. Mobility: OOB when able, up in chair when able  Code Status: Full  Family Communication: will update Disposition: Tele.  Discharge to [facility] tomorrow when bed  available.   Marshell Garfinkel MD [place] [facility] 05/08/2019, 4:51 PM

## 2019-05-08 NOTE — Progress Notes (Addendum)
Patient was asked if staff can call his mother to inform her about his situation. Patient asked writer to removed his mother's name from our contact information list because "my mom is not that type of person who cares for me". Also he asked writer to add to the list his girlfriend's name, Pamala Hurry Redlin. Writer was able to talk with patient's father and gave information about the patient; answered all questions. Will continue to monitor.

## 2019-05-09 LAB — CBC
HCT: 37 % — ABNORMAL LOW (ref 39.0–52.0)
Hemoglobin: 12.6 g/dL — ABNORMAL LOW (ref 13.0–17.0)
MCH: 32.4 pg (ref 26.0–34.0)
MCHC: 34.1 g/dL (ref 30.0–36.0)
MCV: 95.1 fL (ref 80.0–100.0)
Platelets: 226 10*3/uL (ref 150–400)
RBC: 3.89 MIL/uL — ABNORMAL LOW (ref 4.22–5.81)
RDW: 12.9 % (ref 11.5–15.5)
WBC: 5.7 10*3/uL (ref 4.0–10.5)
nRBC: 0 % (ref 0.0–0.2)

## 2019-05-09 LAB — GLUCOSE, CAPILLARY
Glucose-Capillary: 87 mg/dL (ref 70–99)
Glucose-Capillary: 93 mg/dL (ref 70–99)

## 2019-05-09 MED ORDER — LAMOTRIGINE 25 MG PO TABS
25.0000 mg | ORAL_TABLET | Freq: Every day | ORAL | Status: DC
  Administered 2019-05-09 – 2019-05-12 (×4): 25 mg via ORAL
  Filled 2019-05-09 (×4): qty 1

## 2019-05-09 MED ORDER — LORAZEPAM 2 MG/ML IJ SOLN
1.0000 mg | Freq: Once | INTRAMUSCULAR | Status: AC
  Administered 2019-05-09: 1 mg via INTRAVENOUS
  Filled 2019-05-09: qty 1

## 2019-05-09 MED ORDER — LAMOTRIGINE 100 MG PO TABS
200.0000 mg | ORAL_TABLET | Freq: Every day | ORAL | Status: DC
  Filled 2019-05-09: qty 2

## 2019-05-09 MED ORDER — ZOLPIDEM TARTRATE 5 MG PO TABS
10.0000 mg | ORAL_TABLET | Freq: Once | ORAL | Status: AC
  Administered 2019-05-09: 10 mg via ORAL
  Filled 2019-05-09: qty 2

## 2019-05-09 MED ORDER — LAMOTRIGINE 25 MG PO TABS: 25.0000 mg | ORAL_TABLET | Freq: Every day | ORAL | Status: DC

## 2019-05-09 MED ORDER — LORAZEPAM 2 MG/ML IJ SOLN
2.0000 mg | Freq: Once | INTRAMUSCULAR | Status: AC
  Administered 2019-05-09: 2 mg via INTRAVENOUS
  Filled 2019-05-09: qty 1

## 2019-05-09 NOTE — Progress Notes (Signed)
CSW received request to speak with patient regarding bed availability. CSW and RNCM spoke with patient. He listed several complaints regarding his lack of anxiety medication and the fact that he is not getting any behavioral health treatment. MD aware and consulting psychiatry. Patient stated that he wanted to leave and CSW explained that he is under IVC and cannot safely do so. Patient stated that he has not been seen by the MD or by psychiatry. CSW assured him that the MD had been by earlier in the day and he had been seen by psych on 6/30. Patient stated that he did not speak with anyone (though medical team says both physicians saw patient). CSW alerted him that there are no beds available as of yet but that we will continue to search. CSW sent referral to [facility]. [place] [facility] continue not to have beds and patient was denied by Ellin [facility]hew.   Percell Locus Tersa Fotopoulos LCSW [phone]

## 2019-05-09 NOTE — Progress Notes (Signed)
Pt removed tele and threw it in the trash. He is refusing to put it back on. MD notified.

## 2019-05-09 NOTE — Progress Notes (Signed)
eLink Physician-Brief Progress Note Patient Name: Mario Cameron DOB: [birth year] MRN: [id number]   Date of Service  05/09/2019  HPI/Events of Note  Call from bedside nurse reporting that patient is very anxious and agitated related to sitting around waiting for a [facility] bed.  He is upset about frequent blood sugar checks, and the withholding of his psychotropic meds.  States he cannot sleep.  After a brief discussion the patient agreed to the one time dose of his Lorrin Mais and a higher dose of ativan with the plan for him to talk with his rounding team in the AM regarding transfer  eICU Interventions  Plan: One time dose of ambien 10 mg po One time dose of 2 mg of ativan IV D/C q4 hour CBG     Intervention Category Major Interventions: Other:  [name] 05/09/2019, 1:35 AM

## 2019-05-09 NOTE — Progress Notes (Signed)
Pt frequently requesting to know if bed placement is happening today and states if it is not he is going home. MD notified and IVC is being put in place. Pt states he wants to speak with doctor in person regarding his plan of care and his concerns. MD notified. Will continue to monitor.

## 2019-05-09 NOTE — Progress Notes (Signed)
Went in room to give pt scheduled medication. Pt was on phone with mother. He asked what the main number to the nurses station was so he could give it to her. I gave him the number. I then received call from pt's mother asking what the plan was. I updated her that we were waiting on bed placement and that he had just received his Lamotrigine and Ativan to help ease his anxieties. I told her I will keep pt. updated as to when a bed becomes available.

## 2019-05-09 NOTE — Progress Notes (Signed)
At approximately 0120, this nurse responded to a call for assistance from the patient's assigned sitter.  When I came to the bedside, the patient was extremely anxious, stated that he has been waiting for a [facility] bed, stated "my needs are not being met," "I haven't gotten any sleep," overall did not feel informed about his plan of care.    This nurse paged the critical care on-call provider, who spoke with the patient personally over the phone about his concerns.  The patient was prescribed a one-time dose of ativan 5m and ambien 128mto help with anxiety and sleep.  The patient's Q4H blood sugars were also discontinued.  The patient did not have IV access, so this nurse placed a #20G in the left hand for administration of the ativan.    The patient requested that he not be disturbed by phlebotomy or NT's for vital signs for the remainder of the night.  This nurse modified his lab draw time to 0900 and instructed the NT's not to take his vital signs until he had awakened for the morning.    Informed the patient's nurse Rose of all modifications to the plan of care.

## 2019-05-09 NOTE — Progress Notes (Signed)
PROGRESS NOTE  Mario Cameron ZOX:[id number] DOB: [birth year] DOA: 05/05/2019 PCP: Jarold MottoWorley, Samantha, PA  HPI/Recap of past 3624 hours: 48 year old male PMH bipolar disorder, anxiety, depression, GERD, migraines, gastric sleeve surgery (remote), recently admitted with palpitations, presyncope and lightheadedness dx with atrial flutter, brought to  ED after being found unresponsive in his apartment complex. Initially was minimally responsive to pain when examined by EMS. No response to narcan 3mg  by EMS, 500 mg NS for hypotension. Initially in ED 7pm.   Given additional 2mg  narcan in ED - no change.  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  Per them, he wrote notes discussing his end of life wishes if he were to die or be unable to make decisions due to complications from upcoming cardiac procedure.    He was recently started on diltiazem and xarelto earlier this month (xarelto d/c after 2 weeks per EP), then on 6/23 started flecanide prn for ongoing palpitations.    Of note, during admission, upon RN belongings assessment, notepad with instructions for family in the event of completed suicide was found along with advanced directives and living will.  Psych eval completed 6/30 where pt did admit to SI by intentional overdose.  05/09/19: Patient seen and examined at his bedside.  One-to-one sitter present.  He denies any chest pain, dyspnea or palpitations.  He has no new complaints.  He denies suicidal ideation or homicidal ideation.  States he wants to sleep.  [facility]  Assessment/Plan: Principal Problem:   Suicide attempt [facility](HCC) Active Problems:   Acute encephalopathy  Resolved acute metabolic encephalopathy likely secondary to intentional overdose amitriptyline and Ambien Head  CT done on admission unremarkable for any acute intracranial findings.  Suicidal attempt Evaluated by psych with plan [facility] Continue to hold all psychotropic medications Overnight received 1 time dose of Ambien and one-time dose of 2 mg of IV Ativan. Inpatient psych admission when bed is available.  Patient with atrial flutter with RVR Heart rate is currently controlled Preadmission medication Lipitor, diltiazem, prazosin, flecainide as needed are being held Independently reviewed vital signs and labs which are stable.    Code Status: Full code  Family Communication: None at bedside.  Disposition Plan: [facility] psych admission when bed is available.   Consultants:  Psych  Procedures:  Intubation on 05/05/2019  Extubation on 05/06/2019  Antimicrobials:  None  DVT prophylaxis: Subcu heparin 3 times daily   Objective: Vitals:   05/07/19 2144 05/08/19 0503 05/08/19 1151 05/08/19 2151  BP: 120/83 (!) 115/91 (!) 125/99 (!) 131/92  Pulse: 90 81 87 75  Resp: 16 16 (!) 22 18  Temp: 97.7 F (36.5 C) 98.1 F (36.7 C)  98.2 F (36.8 C)  TempSrc:  Oral  Oral  SpO2: 98% 97% 99% 99%  Weight:      Height:        Intake/Output Summary (Last 24 hours) at 05/09/2019 [id number] Last data filed at 05/08/2019 2223 Gross per 24 hour  Intake 947 ml  Output -  Net 947 ml   Filed Weights   05/05/19 2219  Weight: 100 kg    Exam:  . General: 48 y.o. year-old male well developed well nourished in no acute distress.  Somnolent but  easily arousable to voices. . Cardiovascular: Regular rate and rhythm with no rubs or gallops.  No thyromegaly or JVD noted.   Marland Kitchen Respiratory: Clear to auscultation with no wheezes or rales. Good inspiratory effort. . Abdomen: Soft nontender nondistended with normal bowel sounds x4 quadrants. . Musculoskeletal: No lower extremity edema. 2/4 pulses in all 4 extremities. Marland Kitchen Psychiatry: Unable to assess mood due to  somnolence.   Data Reviewed: CBC: Recent Labs  Lab 05/05/19 2009 05/05/19 2056 05/05/19 2128 05/06/19 0150 05/07/19 0356 05/08/19 0340  WBC 8.3  --   --  11.9* 8.8 6.1  NEUTROABS 5.9  --   --   --   --   --   HGB 13.3 11.9* 11.9* 13.0 12.1* 12.1*  HCT 40.7 35.0* 35.0* 38.3* 36.2* 36.9*  MCV 97.8  --   --  94.8 95.8 96.3  PLT 243  --   --  235 216 510   Basic Metabolic Panel: Recent Labs  Lab 05/05/19 2009 05/05/19 2056 05/05/19 2128 05/06/19 0150 05/07/19 0356 05/08/19 0340  NA 135 140 139 140 141 142  K 3.1* 3.4* 3.6 3.6 3.4* 4.3  CL 103  --   --  106 109 109  CO2 21*  --   --  24 24 25   GLUCOSE 145*  --   --  131* 114* 100*  BUN 10  --   --  11 10 11   CREATININE 1.04  --   --  0.96 0.85 0.92  CALCIUM 8.6*  --   --  8.4* 8.7* 8.6*  MG  --   --   --  1.9 2.1  --   PHOS  --   --   --  3.7  --   --    GFR: Estimated Creatinine Clearance: 124.2 mL/min (by C-G formula based on SCr of 0.92 mg/dL). Liver Function Tests: Recent Labs  Lab 05/05/19 2009 05/06/19 0150 05/07/19 0356  AST 17  --  14*  ALT 17  --  15  ALKPHOS 69  --  67  BILITOT 0.5  --  0.8  PROT 6.3*  --  6.0*  ALBUMIN 3.9 3.7 3.5   No results for input(s): LIPASE, AMYLASE in the last 168 hours. Recent Labs  Lab 05/05/19 2011  AMMONIA 18   Coagulation Profile: No results for input(s): INR, PROTIME in the last 168 hours. Cardiac Enzymes: No results for input(s): CKTOTAL, CKMB, CKMBINDEX, TROPONINI in the last 168 hours. BNP (last 3 results) No results for input(s): PROBNP in the last 8760 hours. HbA1C: Recent Labs    05/06/19 1133  HGBA1C 5.2   CBG: Recent Labs  Lab 05/08/19 1151 05/08/19 1656 05/08/19 2052 05/09/19 0009 05/09/19 0842  GLUCAP 96 99 91 93 87   Lipid Profile: No results for input(s): CHOL, HDL, LDLCALC, TRIG, CHOLHDL, LDLDIRECT in the last 72 hours. Thyroid Function Tests: No results for input(s): TSH, T4TOTAL, FREET4, T3FREE, THYROIDAB in the last 72 hours.  Anemia Panel: No results for input(s): VITAMINB12, FOLATE, FERRITIN, TIBC, IRON, RETICCTPCT in the last 72 hours. Urine analysis:    Component Value Date/Time   COLORURINE YELLOW 05/05/2019 2148   APPEARANCEUR CLEAR 05/05/2019 2148   LABSPEC 1.013 05/05/2019 2148   PHURINE 6.0 05/05/2019 2148   GLUCOSEU NEGATIVE 05/05/2019 2148   HGBUR NEGATIVE 05/05/2019 2148   BILIRUBINUR NEGATIVE 05/05/2019 2148   BILIRUBINUR neg 01/10/2019 [place] 05/05/2019 2148   PROTEINUR 30 (A) 05/05/2019 2148   UROBILINOGEN  0.2 01/10/2019 1643   NITRITE NEGATIVE 05/05/2019 2148   LEUKOCYTESUR NEGATIVE 05/05/2019 2148   Sepsis Labs: @LABRCNTIP (procalcitonin:4,lacticidven:4)  ) Recent Results (from the past 240 hour(s))  SARS Coronavirus 2 (CEPHEID- Performed in [facility] lab), Hosp Order     Status: None   Collection Time: 05/05/19  8:11 PM   Specimen: Nasopharyngeal Swab  Result Value Ref Range Status   SARS Coronavirus 2 NEGATIVE NEGATIVE Final    Comment: (NOTE) If result is NEGATIVE SARS-CoV-2 target nucleic acids are NOT DETECTED. The SARS-CoV-2 RNA is generally detectable in upper and lower  respiratory specimens during the acute phase of infection. The lowest  concentration of SARS-CoV-2 viral copies this assay can detect is 250  copies / mL. A negative result does not preclude SARS-CoV-2 infection  and should not be used as the sole basis for treatment or other  patient management decisions.  A negative result may occur with  improper specimen collection / handling, submission of specimen other  than nasopharyngeal swab, presence of viral mutation(s) within the  areas targeted by this assay, and inadequate number of viral copies  (<250 copies / mL). A negative result must be combined with clinical  observations, patient history, and epidemiological information. If result is POSITIVE SARS-CoV-2 target nucleic acids are DETECTED. The SARS-CoV-2 RNA is generally  detectable in upper and lower  respiratory specimens dur ing the acute phase of infection.  Positive  results are indicative of active infection with SARS-CoV-2.  Clinical  correlation with patient history and other diagnostic information is  necessary to determine patient infection status.  Positive results do  not rule out bacterial infection or co-infection with other viruses. If result is PRESUMPTIVE POSTIVE SARS-CoV-2 nucleic acids MAY BE PRESENT.   A presumptive positive result was obtained on the submitted specimen  and confirmed on repeat testing.  While 2019 novel coronavirus  (SARS-CoV-2) nucleic acids may be present in the submitted sample  additional confirmatory testing may be necessary for epidemiological  and / or clinical management purposes  to differentiate between  SARS-CoV-2 and other Sarbecovirus currently known to infect humans.  If clinically indicated additional testing with an alternate test  methodology [phone]([id number]) is advised. The SARS-CoV-2 RNA is generally  detectable in upper and lower respiratory sp ecimens during the acute  phase of infection. The expected result is Negative. Fact Sheet for Patients:  BoilerBrush.com.cyhttps://www.fda.gov/media/136312/download Fact Sheet for Healthcare Providers: https://pope.com/https://www.fda.gov/media/136313/download This test is not yet approved or cleared by the [place] FDA and has been authorized for detection and/or diagnosis of SARS-CoV-2 by FDA under an Emergency Use Authorization (EUA).  This EUA will remain in effect (meaning this test can be used) for the duration of the COVID-19 declaration under Section 564(b)(1) of the Act, 21 U.S.C. section 360bbb-3(b)(1), unless the authorization is terminated or revoked sooner. Performed at [facility], [address] [place], [place] [address]   Blood culture (routine x 2)     Status: None (Preliminary result)   Collection Time: 05/05/19  8:29 PM   Specimen: BLOOD RIGHT HAND  Result  Value Ref Range Status   Specimen Description BLOOD RIGHT HAND  Final   Special Requests   Final    BOTTLES DRAWN AEROBIC AND ANAEROBIC Blood Culture results may not be optimal due to an inadequate volume of blood received in culture bottles   Culture   Final    NO GROWTH 4 DAYS Performed at [facility], [address]  [place], [place] [address]    Report Status PENDING  Incomplete  Blood culture (routine x 2)     Status: None (Preliminary result)   Collection Time: 05/05/19  8:46 PM   Specimen: BLOOD  Result Value Ref Range Status   Specimen Description BLOOD LEFT ANTECUBITAL  Final   Special Requests   Final    BOTTLES DRAWN AEROBIC AND ANAEROBIC Blood Culture results may not be optimal due to an inadequate volume of blood received in culture bottles   Culture   Final    NO GROWTH 4 DAYS Performed at [facility], [address] [place], [place] [address]    Report Status PENDING  Incomplete  MRSA PCR Screening     Status: None   Collection Time: 05/06/19  5:24 AM  Result Value Ref Range Status   MRSA by PCR NEGATIVE NEGATIVE Final    Comment:        The GeneXpert MRSA Assay (FDA approved for NASAL specimens only), is one component of a comprehensive MRSA colonization surveillance program. It is not intended to diagnose MRSA infection nor to guide or monitor treatment for MRSA infections. Performed at [facility], [address] [place], [place] [address]       Studies: No results found.  Scheduled Meds: . heparin  5,000 Units Subcutaneous Q8H  . pantoprazole  20 mg Oral BID    Continuous Infusions:   LOS: 4 days     Darlin Droparole N Adelene Polivka, MD [facility] Pager [phone]  If 7PM-7AM, please contact night-coverage [email or web address] Password [email or web address] 05/09/2019, 9:04 AM

## 2019-05-09 NOTE — Plan of Care (Signed)
  Problem: Medication Management: Goal: Adhere to prescribed medication regimen Outcome: Progressing   Problem: Health Behavior/Discharge (Transition) Planning: Goal: Ability to manage health-related needs will improve Outcome: Not Progressing   Problem: Coping: Goal: Ability to disclose and discuss thoughts of suicide and self-harm will improve Outcome: Not Progressing

## 2019-05-09 NOTE — Progress Notes (Signed)
CSW received request for an IVC. IVC papers sent to magistrate.   Still no psych beds at [facility] or [facility]. Still waiting on bed at [facility], [place], [place], [place], and [facility].  Percell Locus Nilani Hugill LCSW [phone]

## 2019-05-09 NOTE — Progress Notes (Addendum)
Spoke with Clair Gulling pts. Father and updated him that we are still waiting on a bed for pt. Allowed pt to speak on phone with father using my phone. After about 10 min I told pt I need my phone back just in case I get a call regarding my other pts. Pt tells father we no longer want him talking on phone so he has to go and hangs up. I notify pt that that is not the case at all and that he can use the sitters phone to call his father back. Pt complies. I later on get called back into room by sitter who says he is calling his friend Marya Amsler to come and get him. I tell pt. that he can not use the phone to call friends and asks him for the phone back. Pt refuses to give me phone back and raises his voice stating he is not staying her. After multiple request to obtain phone pt finally gives it back. Pt then heads to the front desk and speaks with charge nurse regarding his concerns. Social worker note regarding plan as to bed placement is read to pt. As well as message left for social worker to update him further. Page to MD also done to notify pt on update regarding bed placment. Pt is now back in room and agitated but sitting in bed. MD ordered ativan. Will offer to pt and continue to monitor.

## 2019-05-10 LAB — CBC
HCT: 38.2 % — ABNORMAL LOW (ref 39.0–52.0)
Hemoglobin: 13 g/dL (ref 13.0–17.0)
MCH: 32.4 pg (ref 26.0–34.0)
MCHC: 34 g/dL (ref 30.0–36.0)
MCV: 95.3 fL (ref 80.0–100.0)
Platelets: 242 10*3/uL (ref 150–400)
RBC: 4.01 MIL/uL — ABNORMAL LOW (ref 4.22–5.81)
RDW: 12.9 % (ref 11.5–15.5)
WBC: 5.8 10*3/uL (ref 4.0–10.5)
nRBC: 0 % (ref 0.0–0.2)

## 2019-05-10 LAB — CULTURE, BLOOD (ROUTINE X 2)
Culture: NO GROWTH
Culture: NO GROWTH

## 2019-05-10 MED ORDER — PANTOPRAZOLE SODIUM 40 MG PO TBEC
40.0000 mg | DELAYED_RELEASE_TABLET | Freq: Every day | ORAL | Status: DC
  Administered 2019-05-10 – 2019-05-11 (×2): 40 mg via ORAL
  Filled 2019-05-10 (×2): qty 1

## 2019-05-10 MED ORDER — LAMOTRIGINE 25 MG PO TABS: 25.0000 mg | ORAL_TABLET | Freq: Every day | ORAL | 0 refills | Status: DC

## 2019-05-10 MED ORDER — VITAMIN D 25 MCG (1000 UNIT) PO TABS
1000.0000 [IU] | ORAL_TABLET | Freq: Every day | ORAL | Status: DC
  Administered 2019-05-10 – 2019-05-12 (×3): 1000 [IU] via ORAL
  Filled 2019-05-10 (×3): qty 1

## 2019-05-10 MED ORDER — B COMPLEX PO TABS: 1.0000 | ORAL_TABLET | Freq: Every day | ORAL | Status: DC

## 2019-05-10 MED ORDER — B COMPLEX-C PO TABS
1.0000 | ORAL_TABLET | Freq: Every day | ORAL | Status: DC
  Administered 2019-05-10 – 2019-05-12 (×3): 1 via ORAL
  Filled 2019-05-10 (×3): qty 1

## 2019-05-10 NOTE — Discharge Summary (Addendum)
Discharge Summary  Mario Cameron UJW:[id number] DOB: 1970-11-10  PCP: Jarold Motto, PA  Admit date: 05/05/2019 Discharge date: 05/10/2019  Time spent: 35 minutes  Recommendations for Outpatient Follow-up:  1. Transfer to [facility] to continue care  Discharge Diagnoses:  Active Hospital Problems   Diagnosis Date Noted   Suicide attempt [facility])    Acute encephalopathy 05/05/2019    Resolved Hospital Problems  No resolved problems to display.    Discharge Condition: Stable  Diet recommendation: Resume previous diet  Vitals:   05/09/19 2128 05/10/19 0500  BP: 113/84 107/74  Pulse: 81 75  Resp: (!) 22 20  Temp: 98.2 F (36.8 C) (!) 97.5 F (36.4 C)  SpO2: 98% 96%    History of present illness:   48 year old male PMH bipolar disorder, anxiety, depression, GERD, migraines, gastric sleeve surgery (remote), recently admitted with palpitations, presyncope and lightheadedness dx with atrial flutter, brought to ED after being found unresponsive in his apartment complex. Initially was minimally responsive to pain when examined by EMS. No response to narcan  by EMS, 500 mg NS for hypotension. Initially in ED 7pm. Given additional  narcan in ED - no change.  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 Per them, he wrote notes discussing his end of life wishes if he were to die or be unable to make decisions due to complications from upcoming cardiac procedure.   He was recently started on diltiazem and xarelto earlier this month (xarelto d/c after 2 weeks per EP), then on 6/23 started flecanide prn for ongoing palpitations.   Of note, during admission, upon RN belongings assessment, notepad with instructions for family in the event of completed suicide was found along with advanced directives  and living will. Psych eval completed 6/30 where pt did admit to SI by intentional overdose.  Transferred to [facility] services on 05/09/2019.  05/10/19: Patient seen and examined at his bedside this morning.  No acute events overnight.  Was restarted on Lamictal lowest dose yesterday 05/09/2019, had been off of this for 5 days.  This morning he denies any cardiopulmonary, neurologic, or GI symptoms.  He has no new complaints.  Patient is medically cleared for discharge to [facility].  All vital signs and labs have been reviewed and are stable.  Awaiting bed placement.    Hospital Course:  Principal Problem:   Suicide attempt [facility]) Active Problems:   Acute encephalopathy  Resolved acute metabolic encephalopathy likely secondary to intentional overdose amitriptyline and Ambien Head CT done on admission unremarkable for any acute intracranial findings.  Suicidal attempt Evaluated by psych with plan for admission to [facility] Restarted Lamictal lowest dose on 05/09/2019 Plan for inpatient psych admission when bed is available.  Resolved atrial flutter with RVR Heart rate is controlled Vital signs reviewed and are stable.    GERD Stable Continue PI   Code Status: Full code   Disposition Plan: Discharge to [facility] for inpatient psych admission when bed is available.   Consultants:  Psych  Procedures:  Intubation on 05/05/2019  Extubation on 05/06/2019  Antimicrobials:  None    Discharge Exam: BP 107/74 (BP Location: Right Arm)    Pulse 75    Temp (!) 97.5 F (36.4 C) (Oral)    Resp 20    Ht 6' (1.829 m)  Wt 100 kg    SpO2 96%    BMI 29.90 kg/m   General: 48 y.o. year-old male well developed well nourished in no acute distress.  Alert and oriented x3.  Cardiovascular: Regular rate and rhythm with no rubs or gallops.  No thyromegaly or JVD noted.    Respiratory: Clear to auscultation with no wheezes or rales. Good inspiratory effort.  Abdomen: Soft nontender nondistended  with normal bowel sounds x4 quadrants.  Musculoskeletal: No lower extremity edema. 2/4 pulses in all 4 extremities.  Psychiatry: Mood is appropriate for condition and setting  Discharge Instructions You were cared for by a hospitalist during your hospital stay. If you have any questions about your discharge medications or the care you received while you were in the hospital after you are discharged, you can call the unit and asked to speak with the hospitalist on call if the hospitalist that took care of you is not available. Once you are discharged, your primary care physician will handle any further medical issues. Please note that NO REFILLS for any discharge medications will be authorized once you are discharged, as it is imperative that you return to your primary care physician (or establish a relationship with a primary care physician if you do not have one) for your aftercare needs so that they can reassess your need for medications and monitor your lab values.  Discharge Instructions    Diet - low sodium heart healthy   Complete by: As directed    Increase activity slowly   Complete by: As directed      Allergies as of 05/10/2019      Reactions   Atorvastatin Other (See Comments)   myalgias   Ibuprofen Other (See Comments)   bleeding   Nsaids Other (See Comments)   bleeding   Wool Alcohol [lanolin] Itching   sherpa wool      Medication List    STOP taking these medications   amitriptyline 100 MG tablet Commonly known as: ELAVIL   busPIRone 10 MG tablet Commonly known as: BUSPAR   venlafaxine XR 150 MG 24 hr capsule Commonly known as: EFFEXOR-XR   zolpidem 5 MG tablet Commonly known as: AMBIEN     TAKE these medications   Afrin Saline Nasal Mist 0.65 % nasal spray Generic drug: sodium chloride Place 1 spray into the nose as needed for congestion.   B COMPLEX PO Take 1 tablet by mouth daily. 10000 IU   CO Q10 PO Take 1 tablet by mouth daily.   diltiazem 240  MG 24 hr capsule Commonly known as: CARDIZEM CD Take 1 capsule (240 mg total) by mouth daily for 30 days. What changed: when to take this   flecainide 100 MG tablet Commonly known as: TAMBOCOR Take one tablet as needed for palpitations.  Up to maximum of 3 tablets in 24 hours. What changed:   how much to take  how to take this  when to take this  reasons to take this  additional instructions   IRON PO Take 2 tablets by mouth daily.   lamoTRIgine 25 MG tablet Commonly known as: LAMICTAL Take 1 tablet (25 mg total) by mouth daily. What changed:   medication strength  how much to take   omeprazole 20 MG capsule Commonly known as: PRILOSEC Take 2 capsules (40 mg total) by mouth daily.   pantoprazole 20 MG tablet Commonly known as: PROTONIX Take 1 tablet (20 mg total) by mouth 2 (two) times daily.   Potassium  75 MG Tabs Take by mouth.   prazosin 5 MG capsule Commonly known as: MINIPRESS Take 5 mg by mouth at bedtime.   TART CHERRY ADVANCED PO Take 1 tablet by mouth daily.   tretinoin 0.1 % cream Commonly known as: RETIN-A Apply 1 application topically at bedtime.   Turmeric 500 MG Caps Take 1 capsule by mouth daily.   VITAMIN D PO Take 5,000 tablets by mouth daily. Pt takes 10,000 IU daily      Allergies  Allergen Reactions   Atorvastatin Other (See Comments)    myalgias   Ibuprofen Other (See Comments)    bleeding   Nsaids Other (See Comments)    bleeding   Wool Alcohol [Lanolin] Itching    sherpa wool      The results of significant diagnostics from this hospitalization (including imaging, microbiology, ancillary and laboratory) are listed below for reference.    Significant Diagnostic Studies: Ct Head Wo Contrast  Result Date: 05/05/2019 CLINICAL DATA:  48 year old male found unresponsive, possible overdose. EXAM: CT HEAD WITHOUT CONTRAST CT CERVICAL SPINE WITHOUT CONTRAST TECHNIQUE: Multidetector CT imaging of the head and cervical  spine was performed following the standard protocol without intravenous contrast. Multiplanar CT image reconstructions of the cervical spine were also generated. COMPARISON:  Head CT 11/18/2018. FINDINGS: CT HEAD FINDINGS Brain: No midline shift, ventriculomegaly, mass effect, evidence of mass lesion, intracranial hemorrhage or evidence of cortically based acute infarction. Gray-white matter differentiation is within normal limits throughout the brain. Vascular: Mild Calcified atherosclerosis at the skull base. No suspicious intracranial vascular hyperdensity. Skull: No acute osseous abnormality identified. Sinuses/Orbits: Tympanic cavities and mastoids are clear. Stable maxillary sinus mucous retention cysts. Mild new ethmoid mucosal thickening. Other: Intubated, some fluid in the pharynx. Visualized orbits and scalp soft tissues are within normal limits. CT CERVICAL SPINE FINDINGS Alignment: Motion artifact below C3, such that imaging of some levels was repeated. Cervicothoracic junction alignment is within normal limits. Bilateral posterior element alignment is within normal limits. Skull base and vertebrae: Visualized skull base is intact. No atlanto-occipital dissociation. No acute osseous abnormality identified. Soft tissues and spinal canal: No prevertebral fluid or swelling. No visible canal hematoma. Visible endotracheal tube and enteric tubes are well positioned. Disc levels: Lower cervical disc and endplate degeneration. No spinal stenosis. Upper chest: Chronic T1 spinous process fracture versus ununited ossification center. Other visible upper thoracic levels appear grossly intact. Partially visible dependent upper lung opacity. There is some fluid in the visible thoracic esophagus which is nondilated. IMPRESSION: 1. Stable and normal noncontrast CT appearance of the brain. 2. No acute traumatic injury identified in the cervical spine. 3. Visible ET tube and enteric tube appear well positioned. 4. There  is some fluid in the thoracic esophagus. Mild atelectasis in the lung apices. Electronically Signed   By: Odessa FlemingH  Aliegha Paullin M.D.   On: 05/05/2019 22:00   Ct Cervical Spine Wo Contrast  Result Date: 05/05/2019 CLINICAL DATA:  48 year old male found unresponsive, possible overdose. EXAM: CT HEAD WITHOUT CONTRAST CT CERVICAL SPINE WITHOUT CONTRAST TECHNIQUE: Multidetector CT imaging of the head and cervical spine was performed following the standard protocol without intravenous contrast. Multiplanar CT image reconstructions of the cervical spine were also generated. COMPARISON:  Head CT 11/18/2018. FINDINGS: CT HEAD FINDINGS Brain: No midline shift, ventriculomegaly, mass effect, evidence of mass lesion, intracranial hemorrhage or evidence of cortically based acute infarction. Gray-white matter differentiation is within normal limits throughout the brain. Vascular: Mild Calcified atherosclerosis at the skull base. No suspicious  intracranial vascular hyperdensity. Skull: No acute osseous abnormality identified. Sinuses/Orbits: Tympanic cavities and mastoids are clear. Stable maxillary sinus mucous retention cysts. Mild new ethmoid mucosal thickening. Other: Intubated, some fluid in the pharynx. Visualized orbits and scalp soft tissues are within normal limits. CT CERVICAL SPINE FINDINGS Alignment: Motion artifact below C3, such that imaging of some levels was repeated. Cervicothoracic junction alignment is within normal limits. Bilateral posterior element alignment is within normal limits. Skull base and vertebrae: Visualized skull base is intact. No atlanto-occipital dissociation. No acute osseous abnormality identified. Soft tissues and spinal canal: No prevertebral fluid or swelling. No visible canal hematoma. Visible endotracheal tube and enteric tubes are well positioned. Disc levels: Lower cervical disc and endplate degeneration. No spinal stenosis. Upper chest: Chronic T1 spinous process fracture versus ununited  ossification center. Other visible upper thoracic levels appear grossly intact. Partially visible dependent upper lung opacity. There is some fluid in the visible thoracic esophagus which is nondilated. IMPRESSION: 1. Stable and normal noncontrast CT appearance of the brain. 2. No acute traumatic injury identified in the cervical spine. 3. Visible ET tube and enteric tube appear well positioned. 4. There is some fluid in the thoracic esophagus. Mild atelectasis in the lung apices. Electronically Signed   By: Odessa FlemingH  Cydnie Deason M.D.   On: 05/05/2019 22:00   Dg Chest Port 1 View  Result Date: 05/06/2019 CLINICAL DATA:  Endotracheal tube adjustment EXAM: PORTABLE CHEST 1 VIEW COMPARISON:  X-ray dated May 05, 2019 FINDINGS: The endotracheal tube terminates above the carina by approximately 3.6 cm. The enteric tube extends below the left hemidiaphragm and terminates over the gastric body. The heart size remains enlarged. The lung volumes are low. There is no pneumothorax. There are streaky bibasilar airspace opacities favored to represent atelectasis. IMPRESSION: 1. Lines and tubes as above. The endotracheal tube terminates approximately 3.6 cm above the carina. 2. Low lung volumes with cardiac enlargement. 3. Streaky bibasilar airspace opacities favored to represent atelectasis. Electronically Signed   By: Katherine Mantlehristopher  Green M.D.   On: 05/06/2019 00:24   Dg Chest Portable 1 View  Result Date: 05/05/2019 CLINICAL DATA:  Intubation EXAM: PORTABLE CHEST 1 VIEW COMPARISON:  April 17, 2019 FINDINGS: The endotracheal tube terminates approximately 6.1 cm above the carina. The endotracheal tube is above the thoracic inlet. The enteric tube extends below the left hemidiaphragm. The tip projects over the gastric body. The heart size is enlarged. The lung volumes are low. There is no pneumothorax. No acute osseous abnormality. There are bibasilar airspace opacities. IMPRESSION: 1. Endotracheal tube terminates above the thoracic  inlet. Repositioning should be considered. 2. Enteric tube projects over the gastric body. 3. Low lung volumes. 4. Bibasilar airspace opacities which may represent atelectasis, infiltrate, or aspiration. Electronically Signed   By: Katherine Mantlehristopher  Green M.D.   On: 05/05/2019 20:41   Dg Chest Port 1 View  Result Date: 04/17/2019 CLINICAL DATA:  Atrial fibrillation. Tachycardia. Shortness of breath. EXAM: PORTABLE CHEST 1 VIEW COMPARISON:  None. FINDINGS: Artifact overlies the chest. Heart and mediastinal shadows are normal. The lungs are clear. No effusions. No abnormal bone finding. IMPRESSION: No active disease. Electronically Signed   By: Paulina FusiMark  Shogry M.D.   On: 04/17/2019 14:25    Microbiology: Recent Results (from the past 240 hour(s))  SARS Coronavirus 2 (CEPHEID- Performed in [facility] lab), Hosp Order     Status: None   Collection Time: 05/05/19  8:11 PM   Specimen: Nasopharyngeal Swab  Result Value Ref Range Status  SARS Coronavirus 2 NEGATIVE NEGATIVE Final    Comment: (NOTE) If result is NEGATIVE SARS-CoV-2 target nucleic acids are NOT DETECTED. The SARS-CoV-2 RNA is generally detectable in upper and lower  respiratory specimens during the acute phase of infection. The lowest  concentration of SARS-CoV-2 viral copies this assay can detect is 250  copies / mL. A negative result does not preclude SARS-CoV-2 infection  and should not be used as the sole basis for treatment or other  patient management decisions.  A negative result may occur with  improper specimen collection / handling, submission of specimen other  than nasopharyngeal swab, presence of viral mutation(s) within the  areas targeted by this assay, and inadequate number of viral copies  (<250 copies / mL). A negative result must be combined with clinical  observations, patient history, and epidemiological information. If result is POSITIVE SARS-CoV-2 target nucleic acids are DETECTED. The SARS-CoV-2 RNA is  generally detectable in upper and lower  respiratory specimens dur ing the acute phase of infection.  Positive  results are indicative of active infection with SARS-CoV-2.  Clinical  correlation with patient history and other diagnostic information is  necessary to determine patient infection status.  Positive results do  not rule out bacterial infection or co-infection with other viruses. If result is PRESUMPTIVE POSTIVE SARS-CoV-2 nucleic acids MAY BE PRESENT.   A presumptive positive result was obtained on the submitted specimen  and confirmed on repeat testing.  While 2019 novel coronavirus  (SARS-CoV-2) nucleic acids may be present in the submitted sample  additional confirmatory testing may be necessary for epidemiological  and / or clinical management purposes  to differentiate between  SARS-CoV-2 and other Sarbecovirus currently known to infect humans.  If clinically indicated additional testing with an alternate test  methodology [phone]) is advised. The SARS-CoV-2 RNA is generally  detectable in upper and lower respiratory sp ecimens during the acute  phase of infection. The expected result is Negative. Fact Sheet for Patients:  BoilerBrush.com.cy Fact Sheet for Healthcare Providers: https://pope.com/ This test is not yet approved or cleared by the [place] FDA and has been authorized for detection and/or diagnosis of SARS-CoV-2 by FDA under an Emergency Use Authorization (EUA).  This EUA will remain in effect (meaning this test can be used) for the duration of the COVID-19 declaration under Section 564(b)(1) of the Act, 21 U.S.C. section 360bbb-3(b)(1), unless the authorization is terminated or revoked sooner. Performed at [facility], [address], [place], [place] [address]   Blood culture (routine x 2)     Status: None (Preliminary result)   Collection Time: 05/05/19  8:29 PM   Specimen: BLOOD RIGHT HAND   Result Value Ref Range Status   Specimen Description BLOOD RIGHT HAND  Final   Special Requests   Final    BOTTLES DRAWN AEROBIC AND ANAEROBIC Blood Culture results may not be optimal due to an inadequate volume of blood received in culture bottles   Culture   Final    NO GROWTH 4 DAYS Performed at [facility], [address], [place], [place] [address]    Report Status PENDING  Incomplete  Blood culture (routine x 2)     Status: None (Preliminary result)   Collection Time: 05/05/19  8:46 PM   Specimen: BLOOD  Result Value Ref Range Status   Specimen Description BLOOD LEFT ANTECUBITAL  Final   Special Requests   Final    BOTTLES DRAWN AEROBIC AND ANAEROBIC Blood Culture  results may not be optimal due to an inadequate volume of blood received in culture bottles   Culture   Final    NO GROWTH 4 DAYS Performed at [facility], [place] [address] [place], [place] [address]    Report Status PENDING  Incomplete  MRSA PCR Screening     Status: None   Collection Time: 05/06/19  5:24 AM  Result Value Ref Range Status   MRSA by PCR NEGATIVE NEGATIVE Final    Comment:        The GeneXpert MRSA Assay (FDA approved for NASAL specimens only), is one component of a comprehensive MRSA colonization surveillance program. It is not intended to diagnose MRSA infection nor to guide or monitor treatment for MRSA infections. Performed at [facility], [place] [address], [place], [place] [address]      Labs: Basic Metabolic Panel: Recent Labs  Lab 05/05/19 2009 05/05/19 2056 05/05/19 2128 05/06/19 0150 05/07/19 0356 05/08/19 0340  NA 135 140 139 140 141 142  K 3.1* 3.4* 3.6 3.6 3.4* 4.3  CL 103  --   --  106 109 109  CO2 21*  --   --  24 24 25   GLUCOSE 145*  --   --  131* 114* 100*  BUN 10  --   --  11 10 11   CREATININE 1.04  --   --  0.96 0.85 0.92  CALCIUM 8.6*  --   --  8.4* 8.7* 8.6*  MG  --   --   --  1.9 2.1  --   PHOS  --   --   --  3.7  --   --    Liver  Function Tests: Recent Labs  Lab 05/05/19 2009 05/06/19 0150 05/07/19 0356  AST 17  --  14*  ALT 17  --  15  ALKPHOS 69  --  67  BILITOT 0.5  --  0.8  PROT 6.3*  --  6.0*  ALBUMIN 3.9 3.7 3.5   No results for input(s): LIPASE, AMYLASE in the last 168 hours. Recent Labs  Lab 05/05/19 2011  AMMONIA 18   CBC: Recent Labs  Lab 05/05/19 2009  05/06/19 0150 05/07/19 0356 05/08/19 0340 05/09/19 0837 05/10/19 0345  WBC 8.3  --  11.9* 8.8 6.1 5.7 5.8  NEUTROABS 5.9  --   --   --   --   --   --   HGB 13.3   < > 13.0 12.1* 12.1* 12.6* 13.0  HCT 40.7   < > 38.3* 36.2* 36.9* 37.0* 38.2*  MCV 97.8  --  94.8 95.8 96.3 95.1 95.3  PLT 243  --  235 216 217 226 242   < > = values in this interval not displayed.   Cardiac Enzymes: No results for input(s): CKTOTAL, CKMB, CKMBINDEX, TROPONINI in the last 168 hours. BNP: BNP (last 3 results) Recent Labs    05/05/19 2009  BNP 9.4    ProBNP (last 3 results) No results for input(s): PROBNP in the last 8760 hours.  CBG: Recent Labs  Lab 05/08/19 1151 05/08/19 1656 05/08/19 2052 05/09/19 0009 05/09/19 0842  GLUCAP 96 99 91 93 87       Signed:  Kayleen Memos, MD [facility] 05/10/2019, 8:28 AM

## 2019-05-11 LAB — CBC
HCT: 39 % (ref 39.0–52.0)
Hemoglobin: 13.2 g/dL (ref 13.0–17.0)
MCH: 32.1 pg (ref 26.0–34.0)
MCHC: 33.8 g/dL (ref 30.0–36.0)
MCV: 94.9 fL (ref 80.0–100.0)
Platelets: 245 10*3/uL (ref 150–400)
RBC: 4.11 MIL/uL — ABNORMAL LOW (ref 4.22–5.81)
RDW: 12.8 % (ref 11.5–15.5)
WBC: 6.3 10*3/uL (ref 4.0–10.5)
nRBC: 0 % (ref 0.0–0.2)

## 2019-05-11 MED ORDER — AMITRIPTYLINE HCL 25 MG PO TABS: 100.0000 mg | ORAL_TABLET | Freq: Every day | ORAL | Status: DC

## 2019-05-11 MED ORDER — BUSPIRONE HCL 5 MG PO TABS: 10.0000 mg | ORAL_TABLET | Freq: Three times a day (TID) | ORAL | Status: DC | PRN

## 2019-05-11 MED ORDER — DILTIAZEM HCL ER COATED BEADS 240 MG PO CP24
240.0000 mg | ORAL_CAPSULE | Freq: Every day | ORAL | Status: DC
  Administered 2019-05-11: 240 mg via ORAL
  Filled 2019-05-11: qty 1

## 2019-05-11 MED ORDER — PRAZOSIN HCL 2 MG PO CAPS
5.0000 mg | ORAL_CAPSULE | Freq: Every day | ORAL | Status: DC
  Administered 2019-05-11: 5 mg via ORAL
  Filled 2019-05-11: qty 1

## 2019-05-11 MED ORDER — VENLAFAXINE HCL ER 75 MG PO CP24: 300.0000 mg | ORAL_CAPSULE | Freq: Every day | ORAL | Status: DC

## 2019-05-11 NOTE — Progress Notes (Signed)
CSW followed up with [facility] who reports no beds this weekend and to check back tomorrow (Monday) for any bed avail.   [place], [place]

## 2019-05-11 NOTE — Progress Notes (Signed)
Patient has been refusing cardiac monitoring. MD Made aware.

## 2019-05-11 NOTE — Plan of Care (Signed)
  Problem: Education: Goal: Knowledge of warning signs, risks, and behaviors that relate to suicide ideation and self-harm behaviors will improve Outcome: Progressing   Problem: Health Behavior/Discharge (Transition) Planning: Goal: Ability to manage health-related needs will improve Outcome: Progressing   Problem: Clinical Measurements: Goal: Remain free from any harm during hospitalization Outcome: Progressing   Problem: Nutrition: Goal: Adequate fluids and nutrition will be maintained Outcome: Progressing   Problem: Coping: Goal: Ability to disclose and discuss thoughts of suicide and self-harm will improve Outcome: Progressing   Problem: Medication Management: Goal: Adhere to prescribed medication regimen Outcome: Progressing   Problem: Sleep Hygiene: Goal: Ability to obtain adequate restful sleep will improve Outcome: Progressing   Problem: Self Esteem: Goal: Ability to verbalize positive feeling about self will improve Outcome: Progressing   Problem: Education: Goal: Knowledge of General Education information will improve Description: Including pain rating scale, medication(s)/side effects and non-pharmacologic comfort measures Outcome: Progressing   Problem: Health Behavior/Discharge Planning: Goal: Ability to manage health-related needs will improve Outcome: Progressing   Problem: Clinical Measurements: Goal: Ability to maintain clinical measurements within normal limits will improve Outcome: Progressing Goal: Will remain free from infection Outcome: Progressing Goal: Diagnostic test results will improve Outcome: Progressing Goal: Respiratory complications will improve Outcome: Progressing Goal: Cardiovascular complication will be avoided Outcome: Progressing   Problem: Activity: Goal: Risk for activity intolerance will decrease Outcome: Progressing   Problem: Nutrition: Goal: Adequate nutrition will be maintained Outcome: Progressing   Problem:  Coping: Goal: Level of anxiety will decrease Outcome: Progressing   Problem: Elimination: Goal: Will not experience complications related to bowel motility Outcome: Progressing Goal: Will not experience complications related to urinary retention Outcome: Progressing   Problem: Pain Managment: Goal: General experience of comfort will improve Outcome: Progressing   Problem: Safety: Goal: Ability to remain free from injury will improve Outcome: Progressing   Problem: Skin Integrity: Goal: Risk for impaired skin integrity will decrease Outcome: Progressing   

## 2019-05-11 NOTE — Progress Notes (Signed)
PROGRESS NOTE  Mario Cameron [id number] DOB: 1971/03/31 DOA: 05/05/2019 PCP: Jarold MottoWorley, Samantha, PA  HPI/Recap of past 7524 hours: 48 year old male PMH bipolar disorder, anxiety, depression, GERD, migraines, gastric sleeve surgery (remote), recently admitted with palpitations, presyncope and lightheadedness dx with atrial flutter, brought to  ED after being found unresponsive in his apartment complex. Initially was minimally responsive to pain when examined by EMS. No response to narcan 3mg  by EMS, 500 mg NS for hypotension. Initially in ED 7pm.   Given additional 2mg  narcan in ED - no change.  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  Per them, he wrote notes discussing his end of life wishes if he were to die or be unable to make decisions due to complications from upcoming cardiac procedure.    He was recently started on diltiazem and xarelto earlier this month and according to note from his cardiologist Dr. Ladona Ridgelaylor on 04/29/2019, he supposed to stop his Xarelto 2 weeks from that date which will be 05/13/2019.  then on 6/23 started flecanide prn for ongoing palpitations.    Of note, during admission, upon RN belongings assessment, notepad with instructions for family in the event of completed suicide was found along with advanced directives and living will.  Psych eval completed 6/30 where pt did admit to SI by intentional overdose.  05/11/19: Patient seen and examined.  He has no complaints regarding his health per se however he has a lot of complaints regarding the care he received.  He claims that no one tells him what is going on with him.  He also claims that he was never seen by Dr. Margo AyeHall, my colleague hospitalist yesterday or day before.  He also claims that nobody told him that he was going to be discharged yesterday however there is  a discharge summary done yesterday by Dr. Margo AyeHall.  I see a note from caseworker/social worker last week where he expressed the same claims and also said that he was never seen by psychiatrist but they had seen him 2.  He wanted me to document our conversation today.  He tells me that he is supposed to be on Xarelto for at least a month however I see note from his cardiologist documented on 04/29/2019 that he is supposed to stop his Xarelto 2 weeks after that which will be 05/13/2019 and he has been off of that Xarelto since hospitalization and he is on DVT prophylaxis Lovenox.  [facility]  Assessment/Plan: Principal Problem:   Suicide attempt [facility](HCC) Active Problems:   Acute encephalopathy  Resolved acute metabolic encephalopathy likely secondary to intentional overdose amitriptyline and Ambien Head CT done on admission unremarkable for any acute intracranial findings.  Suicidal attempt Evaluated by psych with [facility] Continue to hold all psychotropic medications other than Lamictal which he has been on and this is per psychiatry recommendation. Inpatient psych admission when bed is available.  Patient with atrial flutter with RVR Heart rate is currently controlled Will resume his diltiazem and Minipress but hold flecainide for now.  Code Status: Full code  Family Communication: None at bedside.  [facility] or other [facility] facility for inpatient psych admission when bed is available.   Consultants:  Psych  Procedures:  Intubation on 05/05/2019  Extubation on 05/06/2019  Antimicrobials:  None  DVT prophylaxis: Subcu heparin 3 times daily   Objective: Vitals:   05/08/19 2151 05/09/19 2128 05/10/19 0500 05/11/19 0500  BP: (!) 131/92 113/84 107/74 113/75  Pulse: 75 81 75 66  Resp: 18 (!) 22 20 19   Temp: 98.2 F (36.8 C) 98.2 F (36.8 C) (!) 97.5 F (36.4 C) 97.6 F (36.4 C)  TempSrc: Oral  Oral Oral   SpO2: 99% 98% 96% 97%  Weight:      Height:        Intake/Output Summary (Last 24 hours) at 05/11/2019 1100 Last data filed at 05/11/2019 0923 Gross per 24 hour  Intake 410 ml  Output -  Net 410 ml   Filed Weights   05/05/19 2219  Weight: 100 kg    Exam: General exam: Appears calm and comfortable  Respiratory system: Clear to auscultation. Respiratory effort normal. Cardiovascular system: S1 & S2 heard, RRR. No JVD, murmurs, rubs, gallops or clicks. No pedal edema. Gastrointestinal system: Abdomen is nondistended, soft and nontender. No organomegaly or masses felt. Normal bowel sounds heard. Central nervous system: Alert and oriented. No focal neurological deficits. Extremities: Symmetric 5 x 5 power. Skin: No rashes, lesions or ulcers Psychiatry: Judgement and insight appear poor. Mood & affect appropriate.     Data Reviewed: CBC: Recent Labs  Lab 05/05/19 2009  05/07/19 0356 05/08/19 0340 05/09/19 0837 05/10/19 0345 05/11/19 0504  WBC 8.3   < > 8.8 6.1 5.7 5.8 6.3  NEUTROABS 5.9  --   --   --   --   --   --   HGB 13.3   < > 12.1* 12.1* 12.6* 13.0 13.2  HCT 40.7   < > 36.2* 36.9* 37.0* 38.2* 39.0  MCV 97.8   < > 95.8 96.3 95.1 95.3 94.9  PLT 243   < > 216 217 226 242 245   < > = values in this interval not displayed.   Basic Metabolic Panel: Recent Labs  Lab 05/05/19 2009 05/05/19 2056 05/05/19 2128 05/06/19 0150 05/07/19 0356 05/08/19 0340  NA 135 140 139 140 141 142  K 3.1* 3.4* 3.6 3.6 3.4* 4.3  CL 103  --   --  106 109 109  CO2 21*  --   --  24 24 25   GLUCOSE 145*  --   --  131* 114* 100*  BUN 10  --   --  11 10 11   CREATININE 1.04  --   --  0.96 0.85 0.92  CALCIUM 8.6*  --   --  8.4* 8.7* 8.6*  MG  --   --   --  1.9 2.1  --   PHOS  --   --   --  3.7  --   --    GFR: Estimated Creatinine Clearance: 124.2 mL/min (by C-G formula based on SCr of 0.92 mg/dL). Liver Function Tests: Recent Labs  Lab 05/05/19 2009 05/06/19 0150 05/07/19 0356  AST  17  --  14*  ALT 17  --  15  ALKPHOS 69  --  67  BILITOT 0.5  --  0.8  PROT 6.3*  --  6.0*  ALBUMIN 3.9 3.7 3.5   No results for input(s): LIPASE, AMYLASE in the last 168 hours. Recent Labs  Lab 05/05/19 2011  AMMONIA 18   Coagulation Profile: No results for input(s): INR, PROTIME in the last 168 hours. Cardiac Enzymes: No results for input(s): CKTOTAL, CKMB, CKMBINDEX, TROPONINI in the last 168 hours.  BNP (last 3 results) No results for input(s): PROBNP in the last 8760 hours. HbA1C: No results for input(s): HGBA1C in the last 72 hours. CBG: Recent Labs  Lab 05/08/19 1151 05/08/19 1656 05/08/19 2052 05/09/19 0009 05/09/19 0842  GLUCAP 96 99 91 93 87   Lipid Profile: No results for input(s): CHOL, HDL, LDLCALC, TRIG, CHOLHDL, LDLDIRECT in the last 72 hours. Thyroid Function Tests: No results for input(s): TSH, T4TOTAL, FREET4, T3FREE, THYROIDAB in the last 72 hours. Anemia Panel: No results for input(s): VITAMINB12, FOLATE, FERRITIN, TIBC, IRON, RETICCTPCT in the last 72 hours. Urine analysis:    Component Value Date/Time   COLORURINE YELLOW 05/05/2019 2148   APPEARANCEUR CLEAR 05/05/2019 2148   LABSPEC 1.013 05/05/2019 2148   PHURINE 6.0 05/05/2019 2148   GLUCOSEU NEGATIVE 05/05/2019 2148   HGBUR NEGATIVE 05/05/2019 2148   BILIRUBINUR NEGATIVE 05/05/2019 2148   BILIRUBINUR neg 01/10/2019 1643   [place] 05/05/2019 2148   PROTEINUR 30 (A) 05/05/2019 2148   UROBILINOGEN 0.2 01/10/2019 1643   NITRITE NEGATIVE 05/05/2019 2148   LEUKOCYTESUR NEGATIVE 05/05/2019 2148   Sepsis Labs: @LABRCNTIP (procalcitonin:4,lacticidven:4)  ) Recent Results (from the past 240 hour(s))  SARS Coronavirus 2 (CEPHEID- Performed in [place] hospital lab), Hosp Order     Status: None   Collection Time: 05/05/19  8:11 PM   Specimen: Nasopharyngeal Swab  Result Value Ref Range Status   SARS Coronavirus 2 NEGATIVE NEGATIVE Final    Comment: (NOTE) If result is NEGATIVE  SARS-CoV-2 target nucleic acids are NOT DETECTED. The SARS-CoV-2 RNA is generally detectable in upper and lower  respiratory specimens during the acute phase of infection. The lowest  concentration of SARS-CoV-2 viral copies this assay can detect is 250  copies / mL. A negative result does not preclude SARS-CoV-2 infection  and should not be used as the sole basis for treatment or other  patient management decisions.  A negative result may occur with  improper specimen collection / handling, submission of specimen other  than nasopharyngeal swab, presence of viral mutation(s) within the  areas targeted by this assay, and inadequate number of viral copies  (<250 copies / mL). A negative result must be combined with clinical  observations, patient history, and epidemiological information. If result is POSITIVE SARS-CoV-2 target nucleic acids are DETECTED. The SARS-CoV-2 RNA is generally detectable in upper and lower  respiratory specimens dur ing the acute phase of infection.  Positive  results are indicative of active infection with SARS-CoV-2.  Clinical  correlation with patient history and other diagnostic information is  necessary to determine patient infection status.  Positive results do  not rule out bacterial infection or co-infection with other viruses. If result is PRESUMPTIVE POSTIVE SARS-CoV-2 nucleic acids MAY BE PRESENT.   A presumptive positive result was obtained on the submitted specimen  and confirmed on repeat testing.  While 2019 novel coronavirus  (SARS-CoV-2) nucleic acids may be present in the submitted sample  additional confirmatory testing may be necessary for epidemiological  and / or clinical management purposes  to differentiate between  SARS-CoV-2 and other Sarbecovirus currently known to infect humans.  If clinically indicated additional testing with an alternate test  methodology [phone]) is advised. The SARS-CoV-2 RNA is generally  detectable in upper  and lower respiratory sp ecimens during the acute  phase of infection. The expected result is Negative. Fact Sheet for Patients:  StrictlyIdeas.no Fact Sheet for Healthcare Providers: BankingDealers.co.za This test is not yet approved or cleared by the  [employer] tradertates FDA and has been authorized for detection and/or diagnosis of SARS-CoV-2 by FDA under an TEFL teachermergency Use Authorization (EUA).  This EUA will remain in effect (meaning this test can be used) for the duration of the COVID-19 declaration under Section 564(b)(1) of the Act, 21 U.S.C. section 360bbb-3(b)(1), unless the authorization is terminated or revoked sooner. Performed at [facility], [address] [place], [place] [address]   Blood culture (routine x 2)     Status: None   Collection Time: 05/05/19  8:29 PM   Specimen: BLOOD RIGHT HAND  Result Value Ref Range Status   Specimen Description BLOOD RIGHT HAND  Final   Special Requests   Final    BOTTLES DRAWN AEROBIC AND ANAEROBIC Blood Culture results may not be optimal due to an inadequate volume of blood received in culture bottles   Culture   Final    NO GROWTH 5 DAYS Performed at [facility], [address] [place], [place] [address]    Report Status 05/10/2019 FINAL  Final  Blood culture (routine x 2)     Status: None   Collection Time: 05/05/19  8:46 PM   Specimen: BLOOD  Result Value Ref Range Status   Specimen Description BLOOD LEFT ANTECUBITAL  Final   Special Requests   Final    BOTTLES DRAWN AEROBIC AND ANAEROBIC Blood Culture results may not be optimal due to an inadequate volume of blood received in culture bottles   Culture   Final    NO GROWTH 5 DAYS Performed at [facility], [address] [place], [place] [address]    Report Status 05/10/2019 FINAL  Final  MRSA PCR Screening     Status: None   Collection Time: 05/06/19  5:24 AM  Result Value Ref Range Status   MRSA by PCR  NEGATIVE NEGATIVE Final    Comment:        The GeneXpert MRSA Assay (FDA approved for NASAL specimens only), is one component of a comprehensive MRSA colonization surveillance program. It is not intended to diagnose MRSA infection nor to guide or monitor treatment for MRSA infections. Performed at [facility], [address] [place], [place] [address]       Studies: No results found.  Scheduled Meds: . amitriptyline  100 mg Oral QHS  . B-complex with vitamin C  1 tablet Oral Daily  . cholecalciferol  1,000 Units Oral Daily  . diltiazem  240 mg Oral Daily  . heparin  5,000 Units Subcutaneous Q8H  . lamoTRIgine  25 mg Oral Daily  . pantoprazole  40 mg Oral Daily  . prazosin  5 mg Oral QHS  . [START ON 05/12/2019] venlafaxine XR  300 mg Oral Q breakfast    Continuous Infusions:   LOS: 6 days     Hughie Clossavi Shekia Kuper, MD [facility] Pager [phone]  If 7PM-7AM, please contact night-coverage [email or web address] Password [email or web address] 05/11/2019, 11:00 AM

## 2019-05-12 ENCOUNTER — Encounter (HOSPITAL_COMMUNITY): Payer: Self-pay | Admitting: *Deleted

## 2019-05-12 ENCOUNTER — Inpatient Hospital Stay (HOSPITAL_COMMUNITY)
Admission: AD | Admit: 2019-05-12 | Discharge: 2019-05-16 | DRG: 885 | Disposition: A | Discharge status: Home / Self Care | Payer: No Typology Code available for payment source | Source: Intra-hospital | Attending: Psychiatry | Admitting: Psychiatry

## 2019-05-12 ENCOUNTER — Other Ambulatory Visit: Payer: Self-pay

## 2019-05-12 ENCOUNTER — Other Ambulatory Visit: Payer: Self-pay | Admitting: Behavioral Health

## 2019-05-12 DIAGNOSIS — Z9884 Bariatric surgery status: Secondary | ICD-10-CM | POA: Diagnosis not present

## 2019-05-12 DIAGNOSIS — K219 Gastro-esophageal reflux disease without esophagitis: Secondary | ICD-10-CM | POA: Diagnosis present

## 2019-05-12 DIAGNOSIS — F333 Major depressive disorder, recurrent, severe with psychotic symptoms: Secondary | ICD-10-CM | POA: Diagnosis present

## 2019-05-12 DIAGNOSIS — Z886 Allergy status to analgesic agent status: Secondary | ICD-10-CM | POA: Diagnosis not present

## 2019-05-12 DIAGNOSIS — F4310 Post-traumatic stress disorder, unspecified: Secondary | ICD-10-CM | POA: Diagnosis present

## 2019-05-12 DIAGNOSIS — Z915 Personal history of self-harm: Secondary | ICD-10-CM

## 2019-05-12 DIAGNOSIS — Z888 Allergy status to other drugs, medicaments and biological substances status: Secondary | ICD-10-CM

## 2019-05-12 DIAGNOSIS — R45851 Suicidal ideations: Secondary | ICD-10-CM | POA: Diagnosis present

## 2019-05-12 DIAGNOSIS — F410 Panic disorder [episodic paroxysmal anxiety] without agoraphobia: Secondary | ICD-10-CM | POA: Diagnosis present

## 2019-05-12 DIAGNOSIS — G4700 Insomnia, unspecified: Secondary | ICD-10-CM | POA: Diagnosis present

## 2019-05-12 DIAGNOSIS — I4892 Unspecified atrial flutter: Secondary | ICD-10-CM | POA: Diagnosis present

## 2019-05-12 DIAGNOSIS — Z79899 Other long term (current) drug therapy: Secondary | ICD-10-CM | POA: Diagnosis not present

## 2019-05-12 DIAGNOSIS — T1491XA Suicide attempt, initial encounter: Secondary | ICD-10-CM | POA: Diagnosis not present

## 2019-05-12 DIAGNOSIS — Z818 Family history of other mental and behavioral disorders: Secondary | ICD-10-CM | POA: Diagnosis not present

## 2019-05-12 HISTORY — DX: Cardiac arrhythmia, unspecified: I49.9

## 2019-05-12 MED ORDER — TRAZODONE HCL 50 MG PO TABS
50.0000 mg | ORAL_TABLET | Freq: Every evening | ORAL | Status: DC | PRN
  Administered 2019-05-12: 22:00:00 50 mg via ORAL
  Filled 2019-05-12 (×7): qty 1

## 2019-05-12 MED ORDER — PRAZOSIN HCL 2 MG PO CAPS: 2.0000 mg | ORAL_CAPSULE | Freq: Every day | ORAL | 0 refills | Status: DC

## 2019-05-12 MED ORDER — PRAZOSIN HCL 2 MG PO CAPS
2.0000 mg | ORAL_CAPSULE | Freq: Every day | ORAL | Status: DC
  Filled 2019-05-12: qty 1

## 2019-05-12 MED ORDER — HYDROXYZINE HCL 25 MG PO TABS
25.0000 mg | ORAL_TABLET | Freq: Three times a day (TID) | ORAL | Status: DC | PRN
  Administered 2019-05-12: 25 mg via ORAL
  Filled 2019-05-12: qty 1

## 2019-05-12 MED ORDER — DILTIAZEM HCL ER COATED BEADS 180 MG PO CP24: 180.0000 mg | ORAL_CAPSULE | Freq: Every day | ORAL | 0 refills | Status: DC

## 2019-05-12 MED ORDER — ACETAMINOPHEN 325 MG PO TABS
650.0000 mg | ORAL_TABLET | Freq: Four times a day (QID) | ORAL | Status: DC | PRN
  Administered 2019-05-15: 650 mg via ORAL
  Filled 2019-05-12: qty 2

## 2019-05-12 MED ORDER — ALUM & MAG HYDROXIDE-SIMETH 200-200-20 MG/5ML PO SUSP
30.0000 mL | ORAL | Status: DC | PRN
  Administered 2019-05-12 – 2019-05-14 (×2): 30 mL via ORAL
  Filled 2019-05-12 (×2): qty 30

## 2019-05-12 MED ORDER — DILTIAZEM HCL ER COATED BEADS 180 MG PO CP24
180.0000 mg | ORAL_CAPSULE | Freq: Every day | ORAL | Status: DC
  Administered 2019-05-12: 180 mg via ORAL
  Filled 2019-05-12: qty 1

## 2019-05-12 NOTE — Progress Notes (Signed)
Report called to Alison Murray LPN at [facility].

## 2019-05-12 NOTE — Progress Notes (Signed)
D    Pt is depressed and sad   He complained of his roommate leaving the bathroom messy and was moved to another room    He reports poor sleep and depression and anxiety A    Verbal support given   Medications administered and effectiveness monitored   Discussed medications with patient and told him his doctor would collaborate with him about his medications tomorrow   Q 15 min checks R   Pt is safe at this time and verbalized understanding   [place] NOVEL CORONAVIRUS (COVID-19) DAILY CHECK-OFF SYMPTOMS - answer yes or no to each - every day NO YES  Have you had a fever in the past 24 hours?  . Fever (Temp > 37.80C / 100F) X   Have you had any of these symptoms in the past 24 hours? . New Cough .  Sore Throat  .  Shortness of Breath .  Difficulty Breathing .  Unexplained Body Aches   X   Have you had any one of these symptoms in the past 24 hours not related to allergies?   . Runny Nose .  Nasal Congestion .  Sneezing   X   If you have had runny nose, nasal congestion, sneezing in the past 24 hours, has it worsened?  X   EXPOSURES - check yes or no X   Have you traveled outside the state in the past 14 days?  X   Have you been in contact with someone with a confirmed diagnosis of COVID-19 or PUI in the past 14 days without wearing appropriate PPE?  X   Have you been living in the same home as a person with confirmed diagnosis of COVID-19 or a PUI (household contact)?    X   Have you been diagnosed with COVID-19?    X              What to do next: Answered NO to all: Answered YES to anything:   Proceed with unit schedule Follow the [facility] Inpatient Flowsheet.

## 2019-05-12 NOTE — Progress Notes (Signed)
Patient  And mother updated over the phone about discharge plan for today. Patient is fine with the POC, awaiting number to call report.

## 2019-05-12 NOTE — Progress Notes (Signed)
Patient discharge to [facility] escorted by Police IV removed.

## 2019-05-12 NOTE — TOC Transition Note (Addendum)
Transition of Care [facility]) - CM/SW Discharge Note Discharged to [phone].  **Bed will be available after 4 pm **Receiving physician - Dr. Mallie Darting   Patient Details  Name: Mario Cameron MRN: [id number] Date of Birth: July 19, 1971  Transition [facility]) CM/SW Contact:  Sable Feil, LCSW Phone Number: 05/12/2019, 2:00 PM   Clinical Narrative:  Patient IVC'd for drug overdose and suicide attempt. Accepted by [facility], discharging today, 76/20. Bed will be available after 4 pm and transport will be contacted at 4 pm to transport patient.       Patient Goals and CMS Choice - n/a        Discharge Placement  [facility]                     Discharge Plan and Services  Psychiatric treatment at [place] Determinants of Health (SDOH) Interventions  Patient discharging today to [facility] for mental health treatment.   Readmission Risk Interventions No flowsheet data found.

## 2019-05-12 NOTE — Tx Team (Signed)
Initial Treatment Plan 05/12/2019 6:22 PM Mario Cameron GNF:[id number]    PATIENT STRESSORS: Health problems Marital or family conflict   PATIENT STRENGTHS: Ability for insight General fund of knowledge Motivation for treatment/growth   PATIENT IDENTIFIED PROBLEMS: "depression"  anxiety                   DISCHARGE CRITERIA:  Ability to meet basic life and health needs Adequate post-discharge living arrangements Improved stabilization in mood, thinking, and/or behavior  PRELIMINARY DISCHARGE PLAN: Outpatient therapy  PATIENT/FAMILY INVOLVEMENT: This treatment plan has been presented to and reviewed with the patient, Mario Cameron, and/or family member,  The patient and family have been given the opportunity to ask questions and make suggestions.  Megan Mans, RN 05/12/2019, 6:22 PM

## 2019-05-12 NOTE — Progress Notes (Signed)
Patient ID: Mario Cameron, male   DOB: [birth year], 48 y.o.   MRN: [id number] Brief/Interim Summary: 48 year old male PMH bipolar disorder, anxiety, depression, GERD, migraines, gastric sleeve surgery (remote), recently admitted with palpitations, presyncope and lightheadedness dx with atrial flutter, brought to ED after being found unresponsive in his apartment complex. Initially was minimally responsive to pain when examined by EMS. No response to narcan 3mg  by EMS, 500 mg NS for hypotension. Initially in ED 7pm. Given additional 2mg  narcan in ED - no change. 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 Per them, he wrote notes discussing his end of life wishes if he were to die or be unable to make decisions due to complications from upcoming cardiac procedure.   He was recently started on diltiazem and xarelto earlier this monthand according to note from his cardiologist Dr. Lovena Le on 04/29/2019, he supposed to stop his Xarelto 2 weeks from that date which will be 05/13/2019. he was also then on 6/23 started flecanide prn for ongoing palpitations.   Of note, during admission, upon RN belongings assessment, notepad with instructions for family in the event of completed suicide was found along with advanced directives and living will. Psych eval completed 6/30 where pt did admit to SI by intentional overdose.  NSG Note: Pt presents to unit as sad, flat, depressed and anxious. Pt endorses passive s.i. and admits to intentional overdose on Ambien and Amytripyline. Pt states that he doesn't "remember what happened. Only what they told me". Pt says that there was not one precipitating factor that triggered overdose but that he was "just tired of everything". Pt was recently diagnosed with atrial flutter and has a medical h/o fatty liver disease  and gastric sleeve surgery. Jodeci stated that "I.m gay" and mother is absolutely not supportive of his lifestyle. Pt referred to mother as "satan" but says that very is very supportive and always has been. Parents live in [place]. Pt reports feeling hopeless and helpless with low self esteem and "extreme anxiety". Pt anxious about restarting his medications stating "they only gave me Lorazepam in my IV" and I still didn't sleep".   Pt oriented to unit, program and staff. This is first [facility] admission for this pt but has been inpt twice in other states. Pt verbally contracts for safety. Positive for passive s.i.

## 2019-05-12 NOTE — Discharge Summary (Signed)
Physician Discharge Summary  Shourya Macpherson ZOX:[id number] DOB: [birth year] DOA: 05/05/2019  PCP: Jarold Motto, PA  Admit date: 05/05/2019 Discharge date: 05/12/2019  Admitted From: Home Disposition: Inpatient psych unit  Recommendations for Outpatient Follow-up:  1. Follow up with PCP in 1-2 weeks 2. Please obtain BMP/CBC in one week 3. Please follow up on the following pending results:  Home Health: None Equipment/Devices: None  Discharge Condition: Stable CODE STATUS: Full code Diet recommendation: Cardiac  Subjective: Patient seen and examined.  He has no complaints.  Brief/Interim Summary: 48 year old male PMH bipolar disorder, anxiety, depression, GERD, migraines, gastric sleeve surgery (remote), recently admitted with palpitations, presyncope and lightheadedness dx with atrial flutter, brought to ED after being found unresponsive in his apartment complex. Initially was minimally responsive to pain when examined by EMS. No response to narcan  by EMS, 500 mg NS for hypotension. Initially in ED 7pm. Given additional  narcan in ED - no change.  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 Per them, he wrote notes discussing his end of life wishes if he were to die or be unable to make decisions due to complications from upcoming cardiac procedure.   He was recently started on diltiazem and xarelto earlier this month and according to note from his cardiologist Dr. Ladona Ridgel on 04/29/2019, he supposed to stop his Xarelto 2 weeks from that date which will be 05/13/2019.  he was also then on 6/23 started flecanide prn for ongoing palpitations.   Of note, during admission, upon RN belongings assessment, notepad with instructions for family in the event of completed suicide was found along with advanced directives and living  will. Psych eval completed 6/30 where pt did admit to SI by intentional overdose.  Subsequently he was transferred to medical floor under tried hospitalist.  He was seen by psychiatry.  They recommended inpatient psychiatric care.  Finally he has a bed arranged so he will be discharged today in stable condition.  Discharge Diagnoses:  Principal Problem:   Suicide attempt [facility]) Active Problems:   Bipolar 2 disorder (HCC)   Acute encephalopathy    Discharge Instructions  Discharge Instructions    Diet - low sodium heart healthy   Complete by: As directed    Discharge patient   Complete by: As directed    Discharge disposition: 65-Discharged/transferred to Psychiatric Hospital or Psychiatric Unit/Distinct Part of Hospital   Discharge patient date: 05/12/2019   Increase activity slowly   Complete by: As directed      Allergies as of 05/12/2019      Reactions   Atorvastatin Other (See Comments)   myalgias   Ibuprofen Other (See Comments)   bleeding   Nsaids Other (See Comments)   bleeding   Wool Alcohol [lanolin] Itching   sherpa wool      Medication List    STOP taking these medications   amitriptyline 100 MG tablet Commonly known as: ELAVIL   busPIRone 10 MG tablet Commonly known as: BUSPAR   venlafaxine XR 150 MG 24 hr capsule Commonly known as: EFFEXOR-XR   zolpidem 5 MG tablet Commonly known as: AMBIEN     TAKE these medications   Afrin Saline Nasal Mist 0.65 % nasal spray Generic drug: sodium chloride Place 1 spray into the nose as needed for congestion.   B COMPLEX PO Take 1  tablet by mouth daily. 10000 IU   CO Q10 PO Take 1 tablet by mouth daily.   diltiazem 180 MG 24 hr capsule Commonly known as: CARDIZEM CD Take 1 capsule (180 mg total) by mouth daily. What changed:   medication strength  how much to take   flecainide 100 MG tablet Commonly known as: TAMBOCOR Take one tablet as needed for palpitations.  Up to maximum of 3 tablets in 24  hours. What changed:   how much to take  how to take this  when to take this  reasons to take this  additional instructions   IRON PO Take 2 tablets by mouth daily.   lamoTRIgine 25 MG tablet Commonly known as: LAMICTAL Take 1 tablet (25 mg total) by mouth daily. What changed:   medication strength  how much to take   omeprazole 20 MG capsule Commonly known as: PRILOSEC Take 2 capsules (40 mg total) by mouth daily.   pantoprazole 20 MG tablet Commonly known as: PROTONIX Take 1 tablet (20 mg total) by mouth 2 (two) times daily.   Potassium 75 MG Tabs Take by mouth.   prazosin 2 MG capsule Commonly known as: MINIPRESS Take 1 capsule (2 mg total) by mouth at bedtime. What changed:   medication strength  how much to take   TART CHERRY ADVANCED PO Take 1 tablet by mouth daily.   tretinoin 0.1 % cream Commonly known as: RETIN-A Apply 1 application topically at bedtime.   Turmeric 500 MG Caps Take 1 capsule by mouth daily.   VITAMIN D PO Take 5,000 tablets by mouth daily. Pt takes 10,000 IU daily      Follow-up Information    Jarold Motto, [place] Follow up in 1 week(s).   Specialty: Physician Assistant Contact information: [address] [place] [address] [phone]        Jodelle Red, MD .   Specialty: Cardiology Contact information: [address] [place] [address] [phone]          Allergies  Allergen Reactions  . Atorvastatin Other (See Comments)    myalgias  . Ibuprofen Other (See Comments)    bleeding  . Nsaids Other (See Comments)    bleeding  . Wool Alcohol [Lanolin] Itching    sherpa wool    Consultations: Psychiatry   Procedures/Studies: Ct Head Wo Contrast  Result Date: 05/05/2019 CLINICAL DATA:  48 year old male found unresponsive, possible overdose. EXAM: CT HEAD WITHOUT CONTRAST CT CERVICAL SPINE WITHOUT CONTRAST TECHNIQUE: Multidetector CT imaging of the head and  cervical spine was performed following the standard protocol without intravenous contrast. Multiplanar CT image reconstructions of the cervical spine were also generated. COMPARISON:  Head CT 11/18/2018. FINDINGS: CT HEAD FINDINGS Brain: No midline shift, ventriculomegaly, mass effect, evidence of mass lesion, intracranial hemorrhage or evidence of cortically based acute infarction. Gray-white matter differentiation is within normal limits throughout the brain. Vascular: Mild Calcified atherosclerosis at the skull base. No suspicious intracranial vascular hyperdensity. Skull: No acute osseous abnormality identified. Sinuses/Orbits: Tympanic cavities and mastoids are clear. Stable maxillary sinus mucous retention cysts. Mild new ethmoid mucosal thickening. Other: Intubated, some fluid in the pharynx. Visualized orbits and scalp soft tissues are within normal limits. CT CERVICAL SPINE FINDINGS Alignment: Motion artifact below C3, such that imaging of some levels was repeated. Cervicothoracic junction alignment is within normal limits. Bilateral posterior element alignment is within normal limits. Skull base and vertebrae: Visualized skull base is intact. No atlanto-occipital dissociation. No acute osseous  abnormality identified. Soft tissues and spinal canal: No prevertebral fluid or swelling. No visible canal hematoma. Visible endotracheal tube and enteric tubes are well positioned. Disc levels: Lower cervical disc and endplate degeneration. No spinal stenosis. Upper chest: Chronic T1 spinous process fracture versus ununited ossification center. Other visible upper thoracic levels appear grossly intact. Partially visible dependent upper lung opacity. There is some fluid in the visible thoracic esophagus which is nondilated. IMPRESSION: 1. Stable and normal noncontrast CT appearance of the brain. 2. No acute traumatic injury identified in the cervical spine. 3. Visible ET tube and enteric tube appear well positioned.  4. There is some fluid in the thoracic esophagus. Mild atelectasis in the lung apices. Electronically Signed   By: Odessa FlemingH  Hall M.D.   On: 05/05/2019 22:00   Ct Cervical Spine Wo Contrast  Result Date: 05/05/2019 CLINICAL DATA:  48 year old male found unresponsive, possible overdose. EXAM: CT HEAD WITHOUT CONTRAST CT CERVICAL SPINE WITHOUT CONTRAST TECHNIQUE: Multidetector CT imaging of the head and cervical spine was performed following the standard protocol without intravenous contrast. Multiplanar CT image reconstructions of the cervical spine were also generated. COMPARISON:  Head CT 11/18/2018. FINDINGS: CT HEAD FINDINGS Brain: No midline shift, ventriculomegaly, mass effect, evidence of mass lesion, intracranial hemorrhage or evidence of cortically based acute infarction. Gray-white matter differentiation is within normal limits throughout the brain. Vascular: Mild Calcified atherosclerosis at the skull base. No suspicious intracranial vascular hyperdensity. Skull: No acute osseous abnormality identified. Sinuses/Orbits: Tympanic cavities and mastoids are clear. Stable maxillary sinus mucous retention cysts. Mild new ethmoid mucosal thickening. Other: Intubated, some fluid in the pharynx. Visualized orbits and scalp soft tissues are within normal limits. CT CERVICAL SPINE FINDINGS Alignment: Motion artifact below C3, such that imaging of some levels was repeated. Cervicothoracic junction alignment is within normal limits. Bilateral posterior element alignment is within normal limits. Skull base and vertebrae: Visualized skull base is intact. No atlanto-occipital dissociation. No acute osseous abnormality identified. Soft tissues and spinal canal: No prevertebral fluid or swelling. No visible canal hematoma. Visible endotracheal tube and enteric tubes are well positioned. Disc levels: Lower cervical disc and endplate degeneration. No spinal stenosis. Upper chest: Chronic T1 spinous process fracture versus  ununited ossification center. Other visible upper thoracic levels appear grossly intact. Partially visible dependent upper lung opacity. There is some fluid in the visible thoracic esophagus which is nondilated. IMPRESSION: 1. Stable and normal noncontrast CT appearance of the brain. 2. No acute traumatic injury identified in the cervical spine. 3. Visible ET tube and enteric tube appear well positioned. 4. There is some fluid in the thoracic esophagus. Mild atelectasis in the lung apices. Electronically Signed   By: Odessa FlemingH  Hall M.D.   On: 05/05/2019 22:00   Dg Chest Port 1 View  Result Date: 05/06/2019 CLINICAL DATA:  Endotracheal tube adjustment EXAM: PORTABLE CHEST 1 VIEW COMPARISON:  X-ray dated May 05, 2019 FINDINGS: The endotracheal tube terminates above the carina by approximately 3.6 cm. The enteric tube extends below the left hemidiaphragm and terminates over the gastric body. The heart size remains enlarged. The lung volumes are low. There is no pneumothorax. There are streaky bibasilar airspace opacities favored to represent atelectasis. IMPRESSION: 1. Lines and tubes as above. The endotracheal tube terminates approximately 3.6 cm above the carina. 2. Low lung volumes with cardiac enlargement. 3. Streaky bibasilar airspace opacities favored to represent atelectasis. Electronically Signed   By: Katherine Mantlehristopher  Green M.D.   On: 05/06/2019 00:24   Dg Chest Portable 1  View  Result Date: 05/05/2019 CLINICAL DATA:  Intubation EXAM: PORTABLE CHEST 1 VIEW COMPARISON:  April 17, 2019 FINDINGS: The endotracheal tube terminates approximately 6.1 cm above the carina. The endotracheal tube is above the thoracic inlet. The enteric tube extends below the left hemidiaphragm. The tip projects over the gastric body. The heart size is enlarged. The lung volumes are low. There is no pneumothorax. No acute osseous abnormality. There are bibasilar airspace opacities. IMPRESSION: 1. Endotracheal tube terminates above the  thoracic inlet. Repositioning should be considered. 2. Enteric tube projects over the gastric body. 3. Low lung volumes. 4. Bibasilar airspace opacities which may represent atelectasis, infiltrate, or aspiration. Electronically Signed   By: Katherine Mantle M.D.   On: 05/05/2019 20:41   Dg Chest Port 1 View  Result Date: 04/17/2019 CLINICAL DATA:  Atrial fibrillation. Tachycardia. Shortness of breath. EXAM: PORTABLE CHEST 1 VIEW COMPARISON:  None. FINDINGS: Artifact overlies the chest. Heart and mediastinal shadows are normal. The lungs are clear. No effusions. No abnormal bone finding. IMPRESSION: No active disease. Electronically Signed   By: Paulina Fusi M.D.   On: 04/17/2019 14:25     Discharge Exam: Vitals:   05/12/19 0727 05/12/19 1047  BP: 93/65 120/87  Pulse: 81 (!) 101  Resp: 16   Temp: 97.6 F (36.4 C)   SpO2: 96%    Vitals:   05/11/19 2148 05/12/19 0548 05/12/19 0727 05/12/19 1047  BP: 113/79 (!) 94/56 93/65 120/87  Pulse: 85 79 81 (!) 101  Resp: Temp: 98.8 F (37.1 C) 98 F (36.7 C) 97.6 F (36.4 C)   TempSrc: Oral Oral    SpO2: 99% 94% 96%   Weight:      Height:        General: Pt is alert, awake, not in acute distress Cardiovascular: RRR, S1/S2 +, no rubs, no gallops Respiratory: CTA bilaterally, no wheezing, no rhonchi Abdominal: Soft, NT, ND, bowel sounds + Extremities: no edema, no cyanosis    The results of significant diagnostics from this hospitalization (including imaging, microbiology, ancillary and laboratory) are listed below for reference.     Microbiology: Recent Results (from the past 240 hour(s))  SARS Coronavirus 2 (CEPHEID- Performed in [facility] hospital lab), Hosp Order     Status: None   Collection Time: 05/05/19  8:11 PM   Specimen: Nasopharyngeal Swab  Result Value Ref Range Status   SARS Coronavirus 2 NEGATIVE NEGATIVE Final    Comment: (NOTE) If result is NEGATIVE SARS-CoV-2 target nucleic acids are NOT  DETECTED. The SARS-CoV-2 RNA is generally detectable in upper and lower  respiratory specimens during the acute phase of infection. The lowest  concentration of SARS-CoV-2 viral copies this assay can detect is 250  copies / mL. A negative result does not preclude SARS-CoV-2 infection  and should not be used as the sole basis for treatment or other  patient management decisions.  A negative result may occur with  improper specimen collection / handling, submission of specimen other  than nasopharyngeal swab, presence of viral mutation(s) within the  areas targeted by this assay, and inadequate number of viral copies  (<250 copies / mL). A negative result must be combined with clinical  observations, patient history, and epidemiological information. If result is POSITIVE SARS-CoV-2 target nucleic acids are DETECTED. The SARS-CoV-2 RNA is generally detectable in upper and lower  respiratory specimens dur ing the acute phase of infection.  Positive  results are indicative of active infection  with SARS-CoV-2.  Clinical  correlation with patient history and other diagnostic information is  necessary to determine patient infection status.  Positive results do  not rule out bacterial infection or co-infection with other viruses. If result is PRESUMPTIVE POSTIVE SARS-CoV-2 nucleic acids MAY BE PRESENT.   A presumptive positive result was obtained on the submitted specimen  and confirmed on repeat testing.  While 2019 novel coronavirus  (SARS-CoV-2) nucleic acids may be present in the submitted sample  additional confirmatory testing may be necessary for epidemiological  and / or clinical management purposes  to differentiate between  SARS-CoV-2 and other Sarbecovirus currently known to infect humans.  If clinically indicated additional testing with an alternate test  methodology [phone]) is advised. The SARS-CoV-2 RNA is generally  detectable in upper and lower respiratory sp ecimens during  the acute  phase of infection. The expected result is Negative. Fact Sheet for Patients:  BoilerBrush.com.cy Fact Sheet for Healthcare Providers: https://pope.com/ This test is not yet approved or cleared by the [place] FDA and has been authorized for detection and/or diagnosis of SARS-CoV-2 by FDA under an Emergency Use Authorization (EUA).  This EUA will remain in effect (meaning this test can be used) for the duration of the COVID-19 declaration under Section 564(b)(1) of the Act, 21 U.S.C. section 360bbb-3(b)(1), unless the authorization is terminated or revoked sooner. Performed at [facility], [address] [place], [place] [address]   Blood culture (routine x 2)     Status: None   Collection Time: 05/05/19  8:29 PM   Specimen: BLOOD RIGHT HAND  Result Value Ref Range Status   Specimen Description BLOOD RIGHT HAND  Final   Special Requests   Final    BOTTLES DRAWN AEROBIC AND ANAEROBIC Blood Culture results may not be optimal due to an inadequate volume of blood received in culture bottles   Culture   Final    NO GROWTH 5 DAYS Performed at [facility], [address] [place], [place] [address]    Report Status 05/10/2019 FINAL  Final  Blood culture (routine x 2)     Status: None   Collection Time: 05/05/19  8:46 PM   Specimen: BLOOD  Result Value Ref Range Status   Specimen Description BLOOD LEFT ANTECUBITAL  Final   Special Requests   Final    BOTTLES DRAWN AEROBIC AND ANAEROBIC Blood Culture results may not be optimal due to an inadequate volume of blood received in culture bottles   Culture   Final    NO GROWTH 5 DAYS Performed at [facility], [address], [place], [place] [address]    Report Status 05/10/2019 FINAL  Final  MRSA PCR Screening     Status: None   Collection Time: 05/06/19  5:24 AM  Result Value Ref Range Status   MRSA by PCR NEGATIVE NEGATIVE Final    Comment:         The GeneXpert MRSA Assay (FDA approved for NASAL specimens only), is one component of a comprehensive MRSA colonization surveillance program. It is not intended to diagnose MRSA infection nor to guide or monitor treatment for MRSA infections. Performed at [facility], [address], [place], [place] [address]      Labs: BNP (last 3 results) Recent Labs    05/05/19 2009  BNP 9.4   Basic Metabolic Panel: Recent Labs  Lab 05/05/19 2009 05/05/19 2056 05/05/19 2128 05/06/19 0150 05/07/19 0356 05/08/19 0340  NA 135 140 139 140 141 142  K 3.1* 3.4* 3.6 3.6 3.4* 4.3  CL 103  --   --  106 109 109  CO2 21*  --   --  24 24 25   GLUCOSE 145*  --   --  131* 114* 100*  BUN 10  --   --  11 10 11   CREATININE 1.04  --   --  0.96 0.85 0.92  CALCIUM 8.6*  --   --  8.4* 8.7* 8.6*  MG  --   --   --  1.9 2.1  --   PHOS  --   --   --  3.7  --   --    Liver Function Tests: Recent Labs  Lab 05/05/19 2009 05/06/19 0150 05/07/19 0356  AST 17  --  14*  ALT 17  --  15  ALKPHOS 69  --  67  BILITOT 0.5  --  0.8  PROT 6.3*  --  6.0*  ALBUMIN 3.9 3.7 3.5   No results for input(s): LIPASE, AMYLASE in the last 168 hours. Recent Labs  Lab 05/05/19 2011  AMMONIA 18   CBC: Recent Labs  Lab 05/05/19 2009  05/07/19 0356 05/08/19 0340 05/09/19 0837 05/10/19 0345 05/11/19 0504  WBC 8.3   < > 8.8 6.1 5.7 5.8 6.3  NEUTROABS 5.9  --   --   --   --   --   --   HGB 13.3   < > 12.1* 12.1* 12.6* 13.0 13.2  HCT 40.7   < > 36.2* 36.9* 37.0* 38.2* 39.0  MCV 97.8   < > 95.8 96.3 95.1 95.3 94.9  PLT 243   < > 216 217 226 242 245   < > = values in this interval not displayed.   Cardiac Enzymes: No results for input(s): CKTOTAL, CKMB, CKMBINDEX, TROPONINI in the last 168 hours. BNP: Invalid input(s): POCBNP CBG: Recent Labs  Lab 05/08/19 1151 05/08/19 1656 05/08/19 2052 05/09/19 0009 05/09/19 0842  GLUCAP 96 99 91 93 87   D-Dimer No results for input(s): DDIMER in the last  72 hours. Hgb A1c No results for input(s): HGBA1C in the last 72 hours. Lipid Profile No results for input(s): CHOL, HDL, LDLCALC, TRIG, CHOLHDL, LDLDIRECT in the last 72 hours. Thyroid function studies No results for input(s): TSH, T4TOTAL, T3FREE, THYROIDAB in the last 72 hours.  Invalid input(s): FREET3 Anemia work up No results for input(s): VITAMINB12, FOLATE, FERRITIN, TIBC, IRON, RETICCTPCT in the last 72 hours. Urinalysis    Component Value Date/Time   COLORURINE YELLOW 05/05/2019 2148   APPEARANCEUR CLEAR 05/05/2019 2148   LABSPEC 1.013 05/05/2019 2148   PHURINE 6.0 05/05/2019 2148   GLUCOSEU NEGATIVE 05/05/2019 2148   HGBUR NEGATIVE 05/05/2019 2148   BILIRUBINUR NEGATIVE 05/05/2019 2148   BILIRUBINUR neg 01/10/2019 1643   [place] 05/05/2019 2148   PROTEINUR 30 (A) 05/05/2019 2148   UROBILINOGEN 0.2 01/10/2019 1643   NITRITE NEGATIVE 05/05/2019 2148   LEUKOCYTESUR NEGATIVE 05/05/2019 2148   Sepsis Labs Invalid input(s): PROCALCITONIN,  WBC,  LACTICIDVEN Microbiology Recent Results (from the past 240 hour(s))  SARS Coronavirus 2 (CEPHEID- Performed in [place] hospital lab), Hosp Order     Status: None   Collection Time: 05/05/19  8:11 PM   Specimen: Nasopharyngeal Swab  Result Value Ref Range Status   SARS Coronavirus 2 NEGATIVE NEGATIVE Final    Comment: (NOTE) If result is NEGATIVE SARS-CoV-2 target nucleic acids are NOT  DETECTED. The SARS-CoV-2 RNA is generally detectable in upper and lower  respiratory specimens during the acute phase of infection. The lowest  concentration of SARS-CoV-2 viral copies this assay can detect is 250  copies / mL. A negative result does not preclude SARS-CoV-2 infection  and should not be used as the sole basis for treatment or other  patient management decisions.  A negative result may occur with  improper specimen collection / handling, submission of specimen other  than nasopharyngeal swab, presence of viral  mutation(s) within the  areas targeted by this assay, and inadequate number of viral copies  (<250 copies / mL). A negative result must be combined with clinical  observations, patient history, and epidemiological information. If result is POSITIVE SARS-CoV-2 target nucleic acids are DETECTED. The SARS-CoV-2 RNA is generally detectable in upper and lower  respiratory specimens dur ing the acute phase of infection.  Positive  results are indicative of active infection with SARS-CoV-2.  Clinical  correlation with patient history and other diagnostic information is  necessary to determine patient infection status.  Positive results do  not rule out bacterial infection or co-infection with other viruses. If result is PRESUMPTIVE POSTIVE SARS-CoV-2 nucleic acids MAY BE PRESENT.   A presumptive positive result was obtained on the submitted specimen  and confirmed on repeat testing.  While 2019 novel coronavirus  (SARS-CoV-2) nucleic acids may be present in the submitted sample  additional confirmatory testing may be necessary for epidemiological  and / or clinical management purposes  to differentiate between  SARS-CoV-2 and other Sarbecovirus currently known to infect humans.  If clinically indicated additional testing with an alternate test  methodology [phone] is advised. The SARS-CoV-2 RNA is generally  detectable in upper and lower respiratory sp ecimens during the acute  phase of infection. The expected result is Negative. Fact Sheet for Patients:  BoilerBrush.com.cyhttps://www.fda.gov/media/136312/download Fact Sheet for Healthcare Providers: https://pope.com/https://www.fda.gov/media/136313/download This test is not yet approved or cleared by the [place] FDA and has been authorized for detection and/or diagnosis of SARS-CoV-2 by FDA under an Emergency Use Authorization (EUA).  This EUA will remain in effect (meaning this test can be used) for the duration of the COVID-19 declaration under Section 564(b)(1)  of the Act, 21 U.S.C. section 360bbb-3(b)(1), unless the authorization is terminated or revoked sooner. Performed at [facility], [address] [place], [place] [address]   Blood culture (routine x 2)     Status: None   Collection Time: 05/05/19  8:29 PM   Specimen: BLOOD RIGHT HAND  Result Value Ref Range Status   Specimen Description BLOOD RIGHT HAND  Final   Special Requests   Final    BOTTLES DRAWN AEROBIC AND ANAEROBIC Blood Culture results may not be optimal due to an inadequate volume of blood received in culture bottles   Culture   Final    NO GROWTH 5 DAYS Performed at [facility], [address] [place], [place] [address]    Report Status 05/10/2019 FINAL  Final  Blood culture (routine x 2)     Status: None   Collection Time: 05/05/19  8:46 PM   Specimen: BLOOD  Result Value Ref Range Status   Specimen Description BLOOD LEFT ANTECUBITAL  Final   Special Requests   Final    BOTTLES DRAWN AEROBIC AND ANAEROBIC Blood Culture results may not be optimal due to an inadequate volume of blood received in culture bottles   Culture   Final  NO GROWTH 5 DAYS Performed at [facility], [address] [place], [place] [address]    Report Status 05/10/2019 FINAL  Final  MRSA PCR Screening     Status: None   Collection Time: 05/06/19  5:24 AM  Result Value Ref Range Status   MRSA by PCR NEGATIVE NEGATIVE Final    Comment:        The GeneXpert MRSA Assay (FDA approved for NASAL specimens only), is one component of a comprehensive MRSA colonization surveillance program. It is not intended to diagnose MRSA infection nor to guide or monitor treatment for MRSA infections. Performed at [facility], [address] [place], [place] [address]      Time coordinating discharge: 29 minutes  SIGNED:   Hughie Clossavi Zaul Hubers, MD  [facility] 05/12/2019, 11:20 AM Pager [phone]  If 7PM-7AM, please contact night-coverage [email or web address] Password  [email or web address]

## 2019-05-13 DIAGNOSIS — F333 Major depressive disorder, recurrent, severe with psychotic symptoms: Principal | ICD-10-CM

## 2019-05-13 MED ORDER — LAMOTRIGINE 25 MG PO TABS
25.0000 mg | ORAL_TABLET | Freq: Every day | ORAL | Status: DC
  Administered 2019-05-13 – 2019-05-16 (×4): 25 mg via ORAL
  Filled 2019-05-13 (×4): qty 1
  Filled 2019-05-13: qty 7
  Filled 2019-05-13 (×2): qty 1

## 2019-05-13 MED ORDER — PRAZOSIN HCL 2 MG PO CAPS
2.0000 mg | ORAL_CAPSULE | Freq: Every day | ORAL | Status: DC
  Administered 2019-05-13 – 2019-05-15 (×3): 2 mg via ORAL
  Filled 2019-05-13 (×4): qty 1
  Filled 2019-05-13: qty 7

## 2019-05-13 MED ORDER — PANTOPRAZOLE SODIUM 20 MG PO TBEC
20.0000 mg | DELAYED_RELEASE_TABLET | Freq: Two times a day (BID) | ORAL | Status: DC
  Administered 2019-05-13 – 2019-05-16 (×6): 20 mg via ORAL
  Filled 2019-05-13 (×7): qty 1
  Filled 2019-05-13 (×2): qty 7
  Filled 2019-05-13: qty 1

## 2019-05-13 MED ORDER — MIRTAZAPINE 7.5 MG PO TABS
7.5000 mg | ORAL_TABLET | Freq: Every day | ORAL | Status: DC
  Administered 2019-05-13: 21:00:00 7.5 mg via ORAL
  Filled 2019-05-13 (×3): qty 1

## 2019-05-13 MED ORDER — LORAZEPAM 0.5 MG PO TABS
0.5000 mg | ORAL_TABLET | Freq: Four times a day (QID) | ORAL | Status: DC | PRN
  Administered 2019-05-13 – 2019-05-15 (×4): 0.5 mg via ORAL
  Filled 2019-05-13 (×4): qty 1

## 2019-05-13 MED ORDER — DILTIAZEM HCL ER COATED BEADS 180 MG PO CP24
180.0000 mg | ORAL_CAPSULE | Freq: Every day | ORAL | Status: DC
  Administered 2019-05-13 – 2019-05-16 (×4): 180 mg via ORAL
  Filled 2019-05-13: qty 1
  Filled 2019-05-13: qty 7
  Filled 2019-05-13 (×6): qty 1

## 2019-05-13 NOTE — Progress Notes (Signed)

## 2019-05-13 NOTE — Progress Notes (Signed)
Patient states that his positive event for the day is that he met with the staff. No additional details about his day were provided. His goal for tomorrow is to "move forward".
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
## 2019-05-13 NOTE — Plan of Care (Signed)
Progress note  D: pt found in bed; compliant with medication administration. Pt is assertive and knowledgeable regarding his plan of care. Pt was animated when story telling. Pt denies any physical pain. Pt denies si/hi/ah/vh and verbally agrees to approach staff if these become apparent or before harming himself/others while at [facility]: Pt provided support and encouragement. Pt given medication per protocol and standing orders. Q64m safety checks implemented and continued.  R: Pt safe on the unit. Will continue to monitor.  Pt progressing in the following metrics   Problem: Education: Goal: Knowledge of  General Education information/materials will improve Outcome: Progressing Goal: Emotional status will improve Outcome: Progressing Goal: Mental status will improve Outcome: Progressing Goal: Verbalization of understanding the information provided will improve Outcome: Progressing

## 2019-05-13 NOTE — Progress Notes (Signed)
The focus of this group is to help patients establish daily goals to achieve during treatment and discuss how the patient can incorporate goal setting into their daily lives to aide in recovery. 
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
## 2019-05-13 NOTE — Progress Notes (Addendum)
Spiritual care group on grief and loss facilitated by chaplain Jerene Pitch  Group Goal:  Support / Education around grief and loss Members engage in facilitated group support and psycho-social education.  Group Description:  Following introductions and group rules, group members engaged in facilitated group dialog and support around topic of loss, with particular support around experiences of loss in their lives. Group Identified types of loss (relationships / self / things) and identified patterns, circumstances, and changes that precipitate losses. Reflected on thoughts / feelings around loss, normalized grief responses, and recognized variety in grief experience. Patient Progress:  Present at beginning of group.  Pulled to meet with care team.  Returned to group near end of group time.  Offered encouragement to another group member to state to his employer that he needed time off from work to connect with mental health resources.

## 2019-05-14 MED ORDER — MIRTAZAPINE 15 MG PO TABS
15.0000 mg | ORAL_TABLET | Freq: Every day | ORAL | Status: DC
  Administered 2019-05-14 – 2019-05-15 (×2): 15 mg via ORAL
  Filled 2019-05-14 (×3): qty 1
  Filled 2019-05-14: qty 7
  Filled 2019-05-14: qty 1

## 2019-05-14 MED ORDER — VENLAFAXINE HCL ER 37.5 MG PO CP24
37.5000 mg | ORAL_CAPSULE | Freq: Every day | ORAL | Status: DC
  Administered 2019-05-15: 37.5 mg via ORAL
  Filled 2019-05-14 (×4): qty 1
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
## 2019-05-14 NOTE — Progress Notes (Signed)
Adult Psychoeducational Group Note  Date:  05/14/2019 Time:  6:20 PM  Group Topic/Focus:  Goals Group:   The focus of this group is to help patients establish daily goals to achieve during treatment and discuss how the patient can incorporate goal setting into their daily lives to aide in recovery.  Participation Level:  Active  Participation Quality:  Appropriate  Affect:  Appropriate  Cognitive:  Alert  Insight: Appropriate  Engagement in Group:  Engaged  Modes of Intervention:  Discussion  Additional Comments:  Pt attended group and participated in discussion.  Lilas Diefendorf R Million Maharaj 05/14/2019, 6:20 PM

## 2019-05-14 NOTE — Plan of Care (Signed)
Progress note  D: pt found in bed; compliant with medication administration. Pt was animated and cheerful this morning. Pt states he is feeling better each day and had better sleep last night. Pt denies any physical pain or symptoms. Pt denies si/hi/ah/vh and verbally agrees to approach staff if these become apparent or before harming himself/others while at [place]: Pt provided support and encouragement. Pt given medication per protocol and standing orders. Q7m safety checks implemented and continued.  R: Pt safe on the unit. Will continue to monitor.  Pt progressing in the following metrics  Problem: Activity: Goal: Interest or engagement in activities will improve Outcome: Progressing Goal: Sleeping patterns will improve Outcome: Progressing   Problem: Coping: Goal: Ability to verbalize frustrations and anger appropriately will improve Outcome: Progressing Goal: Ability to demonstrate self-control will improve Outcome: Progressing

## 2019-05-14 NOTE — Progress Notes (Signed)
Adult Psychoeducational Group Note  Date:  05/14/2019 Time:  9:45 PM  Group Topic/Focus:  Wrap-Up Group:   The focus of this group is to help patients review their daily goal of treatment and discuss progress on daily workbooks.  Participation Level:  Active  Participation Quality:  Appropriate  Affect:  Appropriate  Cognitive:  Alert  Insight: Appropriate  Engagement in Group:  Engaged  Modes of Intervention:  Discussion  Additional Comments:  Patient stated that today has been a chill day.   Rio Kidane L Narek Kniss 05/14/2019, 9:45 PM

## 2019-05-14 NOTE — Tx Team (Signed)
Interdisciplinary Treatment and Diagnostic Plan Update  05/14/2019 Time of Session: 9:00am Mccall Lomax MRN: [id number]  Principal Diagnosis: <principal problem not specified>  Secondary Diagnoses: Active Problems:   MDD (major depressive disorder), recurrent, severe, with psychosis ([place])   Current Medications:  Current Facility-Administered Medications  Medication Dose Route Frequency Provider Last Rate Last Dose  . acetaminophen (TYLENOL) tablet 650 mg  650 mg Oral Q6H PRN Mordecai Maes, NP      . alum & mag hydroxide-simeth (MAALOX/MYLANTA) 200-200-20 MG/5ML suspension 30 mL  30 mL Oral Q4H PRN Mordecai Maes, NP   30 mL at 05/14/19 0929  . diltiazem (CARDIZEM CD) 24 hr capsule 180 mg  180 mg Oral Daily Cobos, Myer Peer, MD   180 mg at 05/14/19 0750  . lamoTRIgine (LAMICTAL) tablet 25 mg  25 mg Oral Daily Cobos, Myer Peer, MD   25 mg at 05/14/19 0750  . LORazepam (ATIVAN) tablet 0.5 mg  0.5 mg Oral Q6H PRN Cobos, Myer Peer, MD   0.5 mg at 05/13/19 2115  . mirtazapine (REMERON) tablet 7.5 mg  7.5 mg Oral QHS Cobos, Myer Peer, MD   7.5 mg at 05/13/19 2115  . pantoprazole (PROTONIX) EC tablet 20 mg  20 mg Oral BID Cobos, Myer Peer, MD   20 mg at 05/14/19 0750  . prazosin (MINIPRESS) capsule 2 mg  2 mg Oral QHS Cobos, Myer Peer, MD   2 mg at 05/13/19 2115   PTA Medications: Medications Prior to Admission  Medication Sig Dispense Refill Last Dose  . B Complex Vitamins (B COMPLEX PO) Take 1 tablet by mouth daily. 10000 IU     . Coenzyme Q10 (CO Q10 PO) Take 1 tablet by mouth daily.      Marland Kitchen diltiazem (CARDIZEM CD) 180 MG 24 hr capsule Take 1 capsule (180 mg total) by mouth daily. 30 capsule 0   . flecainide (TAMBOCOR) 100 MG tablet Take one tablet as needed for palpitations.  Up to maximum of 3 tablets in 24 hours. (Patient taking differently: Take 100 mg by mouth 3 (three) times daily as needed (heart palpatations). ) 30 tablet 3   . IRON PO Take 2 tablets by mouth daily.       Marland Kitchen lamoTRIgine (LAMICTAL) 25 MG tablet Take 1 tablet (25 mg total) by mouth daily. 30 tablet 0   . Misc Natural Products (TART CHERRY ADVANCED PO) Take 1 tablet by mouth daily.      Marland Kitchen omeprazole (PRILOSEC) 20 MG capsule Take 2 capsules (40 mg total) by mouth daily. 60 capsule 0   . pantoprazole (PROTONIX) 20 MG tablet Take 1 tablet (20 mg total) by mouth 2 (two) times daily. 60 tablet 0   . Potassium 75 MG TABS Take by mouth.     . prazosin (MINIPRESS) 2 MG capsule Take 1 capsule (2 mg total) by mouth at bedtime. 30 capsule 0   . sodium chloride (AFRIN SALINE NASAL MIST) 0.65 % nasal spray Place 1 spray into the nose as needed for congestion.     Marland Kitchen tretinoin (RETIN-A) 0.1 % cream Apply 1 application topically at bedtime.     . Turmeric 500 MG CAPS Take 1 capsule by mouth daily.     Marland Kitchen VITAMIN D PO Take 5,000 tablets by mouth daily. Pt takes 10,000 IU daily       Patient Stressors: Health problems Marital or family conflict  Patient Strengths: Ability for insight General fund of knowledge Motivation for treatment/growth  Treatment Modalities: Medication  Management, Group therapy, Case management,  1 to 1 session with clinician, Psychoeducation, Recreational therapy.   Physician Treatment Plan for Primary Diagnosis: <principal problem not specified> Long Term Goal(s): Improvement in symptoms so as ready for discharge Improvement in symptoms so as ready for discharge   Short Term Goals: Ability to identify changes in lifestyle to reduce recurrence of condition will improve Ability to verbalize feelings will improve Ability to disclose and discuss suicidal ideas Ability to demonstrate self-control will improve Ability to identify and develop effective coping behaviors will improve  Medication Management: Evaluate patient's response, side effects, and tolerance of medication regimen.  Therapeutic Interventions: 1 to 1 sessions, Unit Group sessions and Medication  administration.  Evaluation of Outcomes: Not Met  Physician Treatment Plan for Secondary Diagnosis: Active Problems:   MDD (major depressive disorder), recurrent, severe, with psychosis ([place])  Long Term Goal(s): Improvement in symptoms so as ready for discharge Improvement in symptoms so as ready for discharge   Short Term Goals: Ability to identify changes in lifestyle to reduce recurrence of condition will improve Ability to verbalize feelings will improve Ability to disclose and discuss suicidal ideas Ability to demonstrate self-control will improve Ability to identify and develop effective coping behaviors will improve     Medication Management: Evaluate patient's response, side effects, and tolerance of medication regimen.  Therapeutic Interventions: 1 to 1 sessions, Unit Group sessions and Medication administration.  Evaluation of Outcomes: Not Met   RN Treatment Plan for Primary Diagnosis: <principal problem not specified> Long Term Goal(s): Knowledge of disease and therapeutic regimen to maintain health will improve  Short Term Goals: Ability to remain free from injury will improve, Ability to verbalize frustration and anger appropriately will improve, Ability to identify and develop effective coping behaviors will improve and Compliance with prescribed medications will improve  Medication Management: RN will administer medications as ordered by provider, will assess and evaluate patient's response and provide education to patient for prescribed medication. RN will report any adverse and/or side effects to prescribing provider.  Therapeutic Interventions: 1 on 1 counseling sessions, Psychoeducation, Medication administration, Evaluate responses to treatment, Monitor vital signs and CBGs as ordered, Perform/monitor CIWA, COWS, AIMS and Fall Risk screenings as ordered, Perform wound care treatments as ordered.  Evaluation of Outcomes: Not Met   LCSW Treatment Plan for Primary  Diagnosis: <principal problem not specified> Long Term Goal(s): Safe transition to appropriate next level of care at discharge, Engage patient in therapeutic group addressing interpersonal concerns.  Short Term Goals: Engage patient in aftercare planning with referrals and resources, Increase social support, Identify triggers associated with mental health/substance abuse issues and Increase skills for wellness and recovery  Therapeutic Interventions: Assess for all discharge needs, 1 to 1 time with Social worker, Explore available resources and support systems, Assess for adequacy in community support network, Educate family and significant other(s) on suicide prevention, Complete Psychosocial Assessment, Interpersonal group therapy.  Evaluation of Outcomes: Not Met   Progress in Treatment: Attending groups: Yes. Participating in groups: Yes. Taking medication as prescribed: Yes. Toleration medication: Yes. Family/Significant other contact made: No, will contact:  supports if consents are granted. Patient understands diagnosis: Yes. Discussing patient identified problems/goals with staff: Yes. Medical problems stabilized or resolved: No. Denies suicidal/homicidal ideation: Yes. Issues/concerns per patient self-inventory: Yes.  New problem(s) identified: No, Describe:  CSW continuing to assess  New Short Term/Long Term Goal(s): medication management for mood stabilization; elimination of SI thoughts; development of comprehensive mental wellness/sobriety plan.  Patient Goals:  Discharge Plan or Barriers: CSW continuing to assess for appropriate referrals.   Reason for Continuation of Hospitalization: Anxiety Depression Suicidal ideation  Estimated Length of Stay: 3-5 days  Attendees: Patient: 05/14/2019 11:42 AM  Physician:  05/14/2019 11:42 AM  Nursing:  05/14/2019 11:42 AM  RN Care Manager: 05/14/2019 11:42 AM  Social Worker: Stephanie Acre, [place] 05/14/2019 11:42 AM  Recreational  Therapist:  05/14/2019 11:42 AM  Other:  05/14/2019 11:42 AM  Other:  05/14/2019 11:42 AM  Other: 05/14/2019 11:42 AM    Scribe for Treatment Team: Joellen Jersey, [place] 05/14/2019 11:42 AM

## 2019-05-15 MED ORDER — VENLAFAXINE HCL ER 75 MG PO CP24
75.0000 mg | ORAL_CAPSULE | Freq: Every day | ORAL | Status: DC
  Administered 2019-05-16: 08:00:00 75 mg via ORAL
  Filled 2019-05-15 (×3): qty 1

## 2019-05-15 NOTE — Progress Notes (Signed)
Nursing Progress Note: 7p-7a D: Pt currently presents with a pleasant affect and behavior. Interacting appropriately with the milieu. Pt reports good sleep during the previous night with current medication regimen.  A: Pt provided with medications per providers orders. Pt's labs and vitals were monitored throughout the night. Pt supported emotionally and encouraged to express concerns and questions. Pt educated on medications.  R: Pt's safety ensured with 15 minute and environmental checks. Pt currently denies SI, HI, and AVH. Pt verbally contracts to seek staff if SI,HI, or AVH occurs and to consult with staff before acting on any harmful thoughts. Will continue to monitor.   [place] NOVEL CORONAVIRUS (COVID-19) DAILY CHECK-OFF SYMPTOMS - answer yes or no to each - every day NO YES  Have you had a fever in the past 24 hours?  . Fever (Temp > 37.80C / 100F) X   Have you had any of these symptoms in the past 24 hours? . New Cough .  Sore Throat  .  Shortness of Breath .  Difficulty Breathing .  Unexplained Body Aches   X   Have you had any one of these symptoms in the past 24 hours not related to allergies?   . Runny Nose .  Nasal Congestion .  Sneezing   X   If you have had runny nose, nasal congestion, sneezing in the past 24 hours, has it worsened?  X   EXPOSURES - check yes or no X   Have you traveled outside the state in the past 14 days?  X   Have you been in contact with someone with a confirmed diagnosis of COVID-19 or PUI in the past 14 days without wearing appropriate PPE?  X   Have you been living in the same home as a person with confirmed diagnosis of COVID-19 or a PUI (household contact)?    X   Have you been diagnosed with COVID-19?    X              What to do next: Answered NO to all: Answered YES to anything:   Proceed with unit schedule Follow the [facility] Inpatient Flowsheet.
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
## 2019-05-15 NOTE — Progress Notes (Signed)
DAR NOTE: Patient presents with anxious affect and depressed mood.  Denies suicidal thoughts, auditory and visual hallucinations.  Rates depression at 5, hopelessness at 5, and anxiety at 7.  Maintained on routine safety checks.  Medications given as prescribed.  Support and encouragement offered as needed.  Attended group and participated.  States goal for today is "coping skills and safety plan."  Patient visible in milieu with minimal interaction.  Observed standing across from the nursing station and reading a book.  Requested and received Ativan 0.5 mg for complain of anxiety with good effect.  Patient is safe on and off the unit.

## 2019-05-15 NOTE — Progress Notes (Signed)
DAR NOTE: Pt present with flat affect and depressed mood in the unit. Pt has been isolating himself, been observed seated at the open area reading a book. Pt complained of insomnia,. denies physical pain, took all his meds as scheduled. Pt's safety ensured with 15 minute and environmental checks. Pt currently denies SI/HI and A/V hallucinations. Pt verbally agrees to seek staff if SI/HI or A/VH occurs and to consult with staff before acting on these thoughts. Will continue POC.Mario Cameron

## 2019-05-16 MED ORDER — PRAZOSIN HCL 2 MG PO CAPS: 2.0000 mg | ORAL_CAPSULE | Freq: Every day | ORAL | 0 refills | Status: DC

## 2019-05-16 MED ORDER — VENLAFAXINE HCL ER 75 MG PO CP24: 75.0000 mg | ORAL_CAPSULE | Freq: Every day | ORAL | 0 refills | Status: DC

## 2019-05-16 MED ORDER — MIRTAZAPINE 15 MG PO TABS: 15.0000 mg | ORAL_TABLET | Freq: Every day | ORAL | 0 refills | Status: DC

## 2019-05-16 MED ORDER — DILTIAZEM HCL ER COATED BEADS 180 MG PO CP24: 180.0000 mg | ORAL_CAPSULE | Freq: Every day | ORAL | 0 refills | Status: DC

## 2019-05-16 MED ORDER — PANTOPRAZOLE SODIUM 20 MG PO TBEC: 20.0000 mg | DELAYED_RELEASE_TABLET | Freq: Two times a day (BID) | ORAL | 0 refills | Status: DC

## 2019-05-16 MED ORDER — LAMOTRIGINE 25 MG PO TABS: 25.0000 mg | ORAL_TABLET | Freq: Every day | ORAL | 0 refills | Status: DC

## 2019-05-16 NOTE — Plan of Care (Signed)
  Problem: Education: Goal: Knowledge of [facility] Education information/materials will improve Outcome: Adequate for Discharge Goal: Emotional status will improve Outcome: Adequate for Discharge Goal: Mental status will improve Outcome: Adequate for Discharge Goal: Verbalization of understanding the information provided will improve Outcome: Adequate for Discharge   Problem: Activity: Goal: Interest or engagement in activities will improve Outcome: Adequate for Discharge Goal: Sleeping patterns will improve Outcome: Adequate for Discharge   Problem: Coping: Goal: Ability to verbalize frustrations and anger appropriately will improve Outcome: Adequate for Discharge Goal: Ability to demonstrate self-control will improve Outcome: Adequate for Discharge   Problem: Health Behavior/Discharge Planning: Goal: Identification of resources available to assist in meeting health care needs will improve Outcome: Adequate for Discharge Goal: Compliance with treatment plan for underlying cause of condition will improve Outcome: Adequate for Discharge   Problem: Physical Regulation: Goal: Ability to maintain clinical measurements within normal limits will improve Outcome: Adequate for Discharge   Problem: Safety: Goal: Periods of time without injury will increase Outcome: Adequate for Discharge   Problem: Education: Goal: Ability to state activities that reduce stress will improve Outcome: Adequate for Discharge   Problem: Coping: Goal: Ability to identify and develop effective coping behavior will improve Outcome: Adequate for Discharge   Problem: Self-Concept: Goal: Ability to identify factors that promote anxiety will improve Outcome: Adequate for Discharge Goal: Level of anxiety will decrease Outcome: Adequate for Discharge Goal: Ability to modify response to factors that promote anxiety will improve Outcome: Adequate for Discharge   Problem: Education: Goal:  Utilization of techniques to improve thought processes will improve Outcome: Adequate for Discharge Goal: Knowledge of the prescribed therapeutic regimen will improve Outcome: Adequate for Discharge   Problem: Activity: Goal: Interest or engagement in leisure activities will improve Outcome: Adequate for Discharge Goal: Imbalance in normal sleep/wake cycle will improve Outcome: Adequate for Discharge   Problem: Coping: Goal: Coping ability will improve Outcome: Adequate for Discharge Goal: Will verbalize feelings Outcome: Adequate for Discharge   Problem: Health Behavior/Discharge Planning: Goal: Ability to make decisions will improve Outcome: Adequate for Discharge Goal: Compliance with therapeutic regimen will improve Outcome: Adequate for Discharge   Problem: Role Relationship: Goal: Will demonstrate positive changes in social behaviors and relationships Outcome: Adequate for Discharge   Problem: Safety: Goal: Ability to disclose and discuss suicidal ideas will improve Outcome: Adequate for Discharge Goal: Ability to identify and utilize support systems that promote safety will improve Outcome: Adequate for Discharge   Problem: Self-Concept: Goal: Will verbalize positive feelings about self Outcome: Adequate for Discharge Goal: Level of anxiety will decrease Outcome: Adequate for Discharge   

## 2019-05-16 NOTE — BHH Suicide Risk Assessment (Signed)
[facility]   Principal Problem:  Depression Discharge Diagnoses: Active Problems:   MDD (major depressive disorder), recurrent, severe, with psychosis ([facility])   Total Time spent with patient: 45 minutes  Musculoskeletal: Strength & Muscle Tone: within normal limits Gait & Station: normal Patient leans: N/A  Psychiatric Specialty Exam: ROS no headache, no chest pain, no shortness of breath, no cough, no vomiting,   Blood pressure 108/81, pulse (!) 104, temperature 97.9 F (36.6 C), temperature source Oral, resp. rate 16, height 5\' 10"  (1.778 m), weight 93.4 kg, SpO2 99 %.Body mass index is 29.56 kg/m.  General Appearance: Well Groomed  Eye Contact::  Good  Speech:  Normal Rate409  Volume:  Normal  Mood:  improved mood , states he feels " a lot better"- today presents euthymic  Affect:  Appropriate and reactive  Thought Process:  Linear and Descriptions of Associations: Intact  Orientation:  Full (Time, Place, and Person)  Thought Content:  no hallucinations, no delusions   Suicidal Thoughts:  No denies suicidal or self injurious ideations, denies homicidal or violent ideations  Homicidal Thoughts:  No  Memory:  recent and remote grossly intact   Judgement:  Other:  improving   Insight:  improving   Psychomotor Activity:  Normal  Concentration:  Good  Recall:  Good  Fund of Knowledge:Good  Language: Good  Akathisia:  Negative  Handed:  Right  AIMS (if indicated):     Assets:  Desire for Improvement Resilience  Sleep:  Number of Hours: 6.75  Cognition: WNL  ADL's:  Intact   Mental Status Per Nursing Assessment::   On Admission:  Suicidal ideation indicated by patient, Self-harm thoughts, Intention to act on suicide plan  Demographic Factors:  48 year old male , single, currently unemployed   Loss Factors: Recent job loss, issues related to COVID epidemic  Historical Factors: History of prior psychiatric admissions, history of prior  suicide attempt in 2014. History of depression, does not endorse clear history of hypomania or mania  Risk Reduction Factors:   Positive social support and Positive coping skills or problem solving skills  Continued Clinical Symptoms:  Alert , attentive, well related, calm, pleasant on approach, mood improved and currently presents euthymic, with a full range of affect, no thought disorder, no suicidal or self injurious ideations, no homicidal or violent ideations, no hallucinations, no delusions, future oriented . 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  Behavior on unit calm and in good control. Visible in day room, interactive with peers, pleasant on approach. Currently on Remeron,Effexor XR , Minipress , Lamotrigine - tolerating medications well - we reviewed side effects , including risk of severe rash on Lamotrigine , risk of sedation, weight gain on Remeron, and risk of orthostatic hypotension on Minipress.  Cognitive Features That Contribute To Risk:  No gross cognitive deficits noted upon discharge. Is alert , attentive, and oriented x 3   Suicide Risk:  Mild:  Suicidal ideation of limited frequency, intensity, duration, and specificity.  There are no identifiable plans, no associated intent, mild dysphoria and related symptoms, good self-control (both objective and subjective assessment), few other risk factors, and identifiable protective factors, including available and accessible social support.    Plan Of Care/Follow-up recommendations:  Activity:  as tolerated Diet:  regular Tests:  NA Other:  See below  Patient expresses readiness for  discharge and is leaving in good spirits Has an established PCP , Dr. Morene Rankins , for medical issues as needed  Plans to follow up at [facility] for outpatient treatment.  Jenne Campus, MD 05/16/2019, 8:59 AM

## 2019-05-16 NOTE — BHH Suicide Risk Assessment (Signed)
[place] INPATIENT:  Family/Significant Other Suicide Prevention Education  Suicide Prevention Education:  SPE completed with patient. SPI pamphlet provided to pt and pt was encouraged to share information with support network, ask questions, and talk about any concerns relating to SPE. Patient denies access to guns/firearms and verbalized understanding of information provided. Mobile Crisis information also provided to patient.  Radonna Ricker, MSW, [place] Worker [facility]  Phone: [phone]

## 2019-05-16 NOTE — Progress Notes (Signed)
Adult Psychoeducational Group Note  Date:  05/16/2019 Time:  10:57 AM  Group Topic/Focus:  Goals Group:   The focus of this group is to help patients establish daily goals to achieve during treatment and discuss how the patient can incorporate goal setting into their daily lives to aide in recovery.  Participation Level:  Active  Participation Quality:  Appropriate  Affect:  Appropriate  Cognitive:  Alert  Insight: Appropriate  Engagement in Group:  Engaged  Modes of Intervention:  Discussion  Additional Comments:  Pt attended group and participated in discussion.  Legrand Lasser R Jacqueline Delapena 05/16/2019, 10:57 AM

## 2019-05-16 NOTE — Progress Notes (Signed)
Pt denies SI/HI. Pt denies depression, anxiety and hopelessness today.  Orders reviewed with pt. Verbal support provided. 15 minute checks performed for safety.  Pt verbalizes readiness to discharge today.  [place] NOVEL CORONAVIRUS (COVID-19) DAILY CHECK-OFF SYMPTOMS - answer yes or no to each - every day NO YES  Have you had a fever in the past 24 hours?  . Fever (Temp > 37.80C / 100F) X   Have you had any of these symptoms in the past 24 hours? . New Cough .  Sore Throat  .  Shortness of Breath .  Difficulty Breathing .  Unexplained Body Aches   X   Have you had any one of these symptoms in the past 24 hours not related to allergies?   . Runny Nose .  Nasal Congestion .  Sneezing   X   If you have had runny nose, nasal congestion, sneezing in the past 24 hours, has it worsened?  X   EXPOSURES - check yes or no X   Have you traveled outside the state in the past 14 days?  X   Have you been in contact with someone with a confirmed diagnosis of COVID-19 or PUI in the past 14 days without wearing appropriate PPE?  X   Have you been living in the same home as a person with confirmed diagnosis of COVID-19 or a PUI (household contact)?    X   Have you been diagnosed with COVID-19?    X              What to do next: Answered NO to all: Answered YES to anything:   Proceed with unit schedule Follow the [facility] Inpatient Flowsheet.   

## 2019-05-16 NOTE — Progress Notes (Signed)
  [facility] Adult Case Management Discharge Plan :  Will you be returning to the same living situation after discharge:  Yes,  patient reports he is returning home alone At discharge, do you have transportation home?: Yes,  patient reports his friend is picking him up Do you have the ability to pay for your medications: No.  Release of information consent forms completed and in the chart;  Patient's signature needed at discharge.  Patient to Follow up at: Follow-up Information    [facility], [place] Follow up on 05/22/2019.   Specialty: Physician Assistant Why: Follow up appointment is Thursday, 7/16 at 10:40a.  Appt will be virtual and the provider will contact.   Contact information: [place] [id number] [phone]        [facility] Follow up on 05/23/2019.   Why: Hospital follow up appointment with Dr. Margarito Liner is Friday, 7/17 at 11:00a.  As of right now the appointment will be in the office, they will contact you prior to appt if it will be over the phone or video chat.  Contact information: [place] [address] [phone]           Next level of care provider has access to [place] and Suicide Prevention discussed: Yes,  with the patient      Has patient been referred to the Quitline?: N/A patient is not a smoker  Patient has been referred for addiction treatment: N/A  Marylee Floras, LCSWA 05/16/2019, 11:54 AM
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
## 2019-05-21 NOTE — Progress Notes (Deleted)
Virtual Visit via Video   I connected with Mario Cameron on 05/22/19 at 10:40 AM EDT by a video enabled telemedicine application and verified that I am speaking with the correct person using two identifiers. Location patient: Home Location provider: [facility], Office Persons participating in the virtual visit: Shad Viona GilmoreHelenske, Samantha Worley PA-C.   I discussed the limitations of evaluation and management by telemedicine and the availability of in person appointments. The patient expressed understanding and agreed to proceed.  I acted as a Neurosurgeonscribe for Energy East CorporationSamantha Worley, [employer] ProductsPA-C  , LPN  Subjective:   HPI: Hospital Follow up Pt following up today, was admitted to the hospital on 6/29, found unresponsive from suicide attempt.  ROS: See pertinent positives and negatives per HPI.  Patient Active Problem List   Diagnosis Date Noted  . 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 Suicide attempt (HCC)   . Acute encephalopathy 05/05/2019  . Unspecified atrial flutter (HCC) 04/29/2019  . Atrial flutter with rapid ventricular response (HCC) 04/17/2019  . Atrial fibrillation, rapid (HCC) 04/17/2019  . 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 Dysuria 01/11/2019  . 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 Depression, major, single episode, moderate (HCC) 11/22/2018  . Anxiety 11/22/2018  . Bipolar 2 disorder (HCC) 11/22/2018  . 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 Smoking status: Never Smoker  . Smokeless tobacco: Never Used  Substance Use Topics  . Alcohol use: Not Currently    Current Outpatient Medications:  .  B Complex Vitamins (B COMPLEX PO), Take 1 tablet by mouth daily. 10000 IU, Disp: , Rfl:  .  diltiazem (CARDIZEM CD) 180 MG 24 hr capsule, Take 1 capsule (180 mg total) by mouth daily., Disp: 30 capsule, Rfl: 0 .  IRON PO, Take 2  tablets by mouth daily. , Disp: , Rfl:  .  lamoTRIgine (LAMICTAL) 25 MG tablet, Take 1 tablet (25 mg total) by mouth daily., Disp: 30 tablet, Rfl: 0 .  mirtazapine (REMERON) 15 MG tablet, Take 1 tablet (15 mg total) by mouth at bedtime., Disp: 30 tablet, Rfl: 0 .  pantoprazole (PROTONIX) 20 MG tablet, Take 1 tablet (20 mg total) by mouth 2 (two) times daily., Disp: 60 tablet, Rfl: 0 .  Potassium 75 MG TABS, Take by mouth., Disp: , Rfl:  .  prazosin (MINIPRESS) 2 MG capsule, Take 1 capsule (2 mg total) by mouth at bedtime., Disp: 30 capsule, Rfl: 0 .  venlafaxine XR (EFFEXOR-XR) 75 MG 24 hr capsule, Take 1 capsule (75 mg total) by mouth daily with breakfast., Disp: 30 capsule, Rfl: 0 .  VITAMIN D PO, Take 5,000 tablets by mouth daily. Pt takes 10,000 IU daily, Disp: , Rfl:   Allergies  Allergen Reactions  . Atorvastatin Other (See Comments)    myalgias  . Ibuprofen Other (See Comments)    bleeding  . Nsaids Other (See Comments)    bleeding  . Wool Alcohol [Lanolin] Itching    sherpa wool    Objective:   VITALS: Per patient if applicable, see vitals. GENERAL: Alert, appears well and in no acute distress. HEENT: Atraumatic, conjunctiva clear, no obvious abnormalities on inspection of external nose and ears. NECK: Normal movements of the head and neck. CARDIOPULMONARY: No increased WOB. Speaking in clear sentences. I:E ratio WNL.  MS: Moves all visible extremities without noticeable abnormality. PSYCH: Pleasant and cooperative, well-groomed. Speech  normal rate and rhythm. Affect is appropriate. Insight and judgement are appropriate. Attention is focused, linear, and appropriate.  NEURO: CN grossly intact. Oriented as arrived to appointment on time with no prompting. Moves both UE equally.  SKIN: No obvious lesions, wounds, erythema, or cyanosis noted on face or hands.  Assessment and Plan:   There are no diagnoses linked to this encounter.  . Reviewed expectations re: course of  current medical issues. . Discussed self-management of symptoms. . Outlined signs and symptoms indicating need for more acute intervention. . Patient verbalized understanding and all questions were answered. Marland Kitchen Health Maintenance issues including appropriate healthy diet, exercise, and smoking avoidance were discussed with patient. . See orders for this visit as documented in the electronic medical record.  I discussed the assessment and treatment plan with the patient. The patient was provided an opportunity to ask questions and all were answered. The patient agreed with the plan and demonstrated an understanding of the instructions.   The patient was advised to call back or seek an in-person evaluation if the symptoms worsen or if the condition fails to improve as anticipated.   ***  Anselmo Pickler, LPN [id number]/78/6767

## 2019-05-22 ENCOUNTER — Ambulatory Visit (INDEPENDENT_AMBULATORY_CARE_PROVIDER_SITE_OTHER): Payer: No Typology Code available for payment source | Admitting: Physician Assistant

## 2019-05-22 ENCOUNTER — Other Ambulatory Visit: Payer: Self-pay | Admitting: Internal Medicine

## 2019-05-22 ENCOUNTER — Encounter: Payer: Self-pay | Admitting: Physician Assistant

## 2019-05-22 ENCOUNTER — Other Ambulatory Visit: Payer: Self-pay

## 2019-05-22 VITALS — BP 110/74 | HR 91 | Temp 98.8°F | Ht 70.0 in | Wt 207.5 lb

## 2019-05-22 DIAGNOSIS — Z20828 Contact with and (suspected) exposure to other viral communicable diseases: Secondary | ICD-10-CM

## 2019-05-22 DIAGNOSIS — F321 Major depressive disorder, single episode, moderate: Secondary | ICD-10-CM | POA: Diagnosis not present

## 2019-05-22 DIAGNOSIS — Z20822 Contact with and (suspected) exposure to covid-19: Secondary | ICD-10-CM
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
## 2019-05-22 NOTE — Patient Instructions (Signed)
It was great to see you!  We have put in an order for you to be tested for COVID-19.  You do not need an appointment to go get testing -- please proceed to one of the following drive-up testing locations at your convenience. The line closes at 3:30pm daily. Test results may take up to a week to return.  [facility] Location: [address] [place] ([place] [facility]) Hours: 8a-3:45p, M-F  [facility] Location: [address], [place], [place] [address] [facility]) Hours: 8a-3:45p, M-F  [facility] Location: [facility] (across from [facility]) Hours: 8a-3:45p, M-F  Take care,  Inda Coke PA-C

## 2019-05-23 ENCOUNTER — Inpatient Hospital Stay: Payer: No Typology Code available for payment source | Admitting: Physician Assistant

## 2019-05-27 LAB — NOVEL CORONAVIRUS, NAA: SARS-CoV-2, NAA: NOT DETECTED

## 2019-06-19 ENCOUNTER — Encounter: Payer: Self-pay | Admitting: Physician Assistant
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
## 2019-06-24 ENCOUNTER — Telehealth: Payer: Self-pay | Admitting: *Deleted

## 2019-06-24 ENCOUNTER — Other Ambulatory Visit: Payer: Self-pay

## 2019-06-24 ENCOUNTER — Encounter: Payer: Self-pay | Admitting: Cardiology

## 2019-06-24 ENCOUNTER — Ambulatory Visit (INDEPENDENT_AMBULATORY_CARE_PROVIDER_SITE_OTHER): Payer: No Typology Code available for payment source | Admitting: Cardiology

## 2019-06-24 VITALS — BP 118/70 | HR 86 | Temp 97.6°F | Ht 72.0 in | Wt 210.4 lb

## 2019-06-24 DIAGNOSIS — I4892 Unspecified atrial flutter: Secondary | ICD-10-CM | POA: Diagnosis not present

## 2019-06-24 DIAGNOSIS — Z79899 Other long term (current) drug therapy: Secondary | ICD-10-CM | POA: Diagnosis not present

## 2019-06-24 DIAGNOSIS — Z7189 Other specified counseling: Secondary | ICD-10-CM

## 2019-06-24 DIAGNOSIS — R002 Palpitations: Secondary | ICD-10-CM | POA: Diagnosis not present

## 2019-06-24 NOTE — Telephone Encounter (Signed)
14 day ZIO XT long term holter monitor to be mailed to his home.  Instructions reviewed briefly as they are included in the monitor kit.

## 2019-06-24 NOTE — Patient Instructions (Addendum)
Medication Instructions:  Your Physician recommend you continue on your current medication as directed.    If you need a refill on your cardiac medications before your next appointment, please call your pharmacy.   Lab work: None  Testing/Procedures: Our physician has recommended that you wear 14  DAY ZIO-PATCH monitor. The Zio patch cardiac monitor continuously records heart rhythm data for up to 14 days, this is for patients being evaluated for multiple types heart rhythms. For the first 24 hours post application, please avoid getting the Zio monitor wet in the shower or by excessive sweating during exercise. After that, feel free to carry on with regular activities. Keep soaps and lotions away from the ZIO XT Patch.   This will be placed at our [facility] location - [address].      Follow-Up: At [facility], you and your health needs are our priority.  As part of our continuing mission to provide you with exceptional heart care, we have created designated Provider Care Teams.  These Care Teams include your primary Cardiologist (physician) and Advanced Practice Providers (APPs -  Physician Assistants and Nurse Practitioners) who all work together to provide you with the care you need, when you need it. You will need a follow up appointment in 6 weeks.  Please call our office 2 months in advance to schedule this appointment.  You may see Buford Dresser, MD or one of the following Advanced Practice Providers on your designated Care Team:   Rosaria Ferries, PA-C . Jory Sims, DNP, ANP

## 2019-06-26 ENCOUNTER — Other Ambulatory Visit: Payer: Self-pay | Admitting: Pulmonary Disease

## 2019-06-30 ENCOUNTER — Encounter: Payer: Self-pay | Admitting: Cardiology

## 2019-07-05 ENCOUNTER — Encounter: Payer: Self-pay | Admitting: Physician Assistant

## 2019-07-05 DIAGNOSIS — I4892 Unspecified atrial flutter: Secondary | ICD-10-CM

## 2019-07-07 ENCOUNTER — Other Ambulatory Visit: Payer: Self-pay | Admitting: Physician Assistant

## 2019-07-07 ENCOUNTER — Encounter: Payer: Self-pay | Admitting: *Deleted

## 2019-07-07 MED ORDER — DILTIAZEM HCL ER COATED BEADS 180 MG PO CP24: 180.0000 mg | ORAL_CAPSULE | Freq: Two times a day (BID) | ORAL | 11 refills | Status: DC

## 2019-07-07 MED ORDER — VALACYCLOVIR HCL 1 G PO TABS: ORAL_TABLET | ORAL | 2 refills | Status: DC

## 2019-07-07 NOTE — Telephone Encounter (Signed)
Pt requesting Rx for Valtrex, having outbreak of cold sores. Please advise you have not prescribed.

## 2019-08-04 ENCOUNTER — Encounter: Payer: Self-pay | Admitting: Physician Assistant

## 2019-08-05 MED ORDER — OMEPRAZOLE 20 MG PO CPDR: 20.0000 mg | DELAYED_RELEASE_CAPSULE | Freq: Two times a day (BID) | ORAL | 2 refills | Status: DC

## 2019-08-08 ENCOUNTER — Ambulatory Visit: Payer: No Typology Code available for payment source | Admitting: Cardiology

## 2019-09-05 ENCOUNTER — Encounter: Payer: Self-pay | Admitting: Physician Assistant

## 2019-10-13 ENCOUNTER — Other Ambulatory Visit: Payer: Self-pay | Admitting: Physician Assistant

## 2019-11-13 ENCOUNTER — Encounter: Payer: Self-pay | Admitting: Physician Assistant

## 2019-11-14 ENCOUNTER — Telehealth: Payer: Self-pay | Admitting: Physician Assistant

## 2019-11-14 MED ORDER — OMEPRAZOLE 20 MG PO CPDR: 20.0000 mg | DELAYED_RELEASE_CAPSULE | Freq: Two times a day (BID) | ORAL | 2 refills | Status: DC

## 2019-11-14 NOTE — Telephone Encounter (Signed)
[facility] ([address] [place], [place], [place] [address]) is calling about omeprazole (PRILOSEC) 20 MG capsule, the patient wants to switch pharmacies and cant get hold of [facility]. They were wondering if you guys could send them an okay for this

## 2019-11-14 NOTE — Telephone Encounter (Signed)
Called [facility] pharmacy and spoke to Caroleen, she asked if we could send over Rx for Omeprazole for pt. Told her I will send over right now. Asher Muir verbalized understanding. Rx sent.

## 2019-11-14 NOTE — Telephone Encounter (Signed)
Left message on voicemail to call office. Called pt back and left message that Rx was sent to pharmacy as requested. Any questions please call office.

## 2019-11-14 NOTE — Telephone Encounter (Signed)
I'm forwarding to [facility].  Danielle -- please send all medication changes and calls about medications to Lupita Leash instead of me. Thanks!

## 2019-12-11 IMAGING — DX PORTABLE CHEST - 1 VIEW
1 series · 1 of 1 positions shown · non-contrast
Comparison: April 17, 2019

CLINICAL DATA: Intubation

EXAM:
PORTABLE CHEST 1 VIEW

[chest ap]
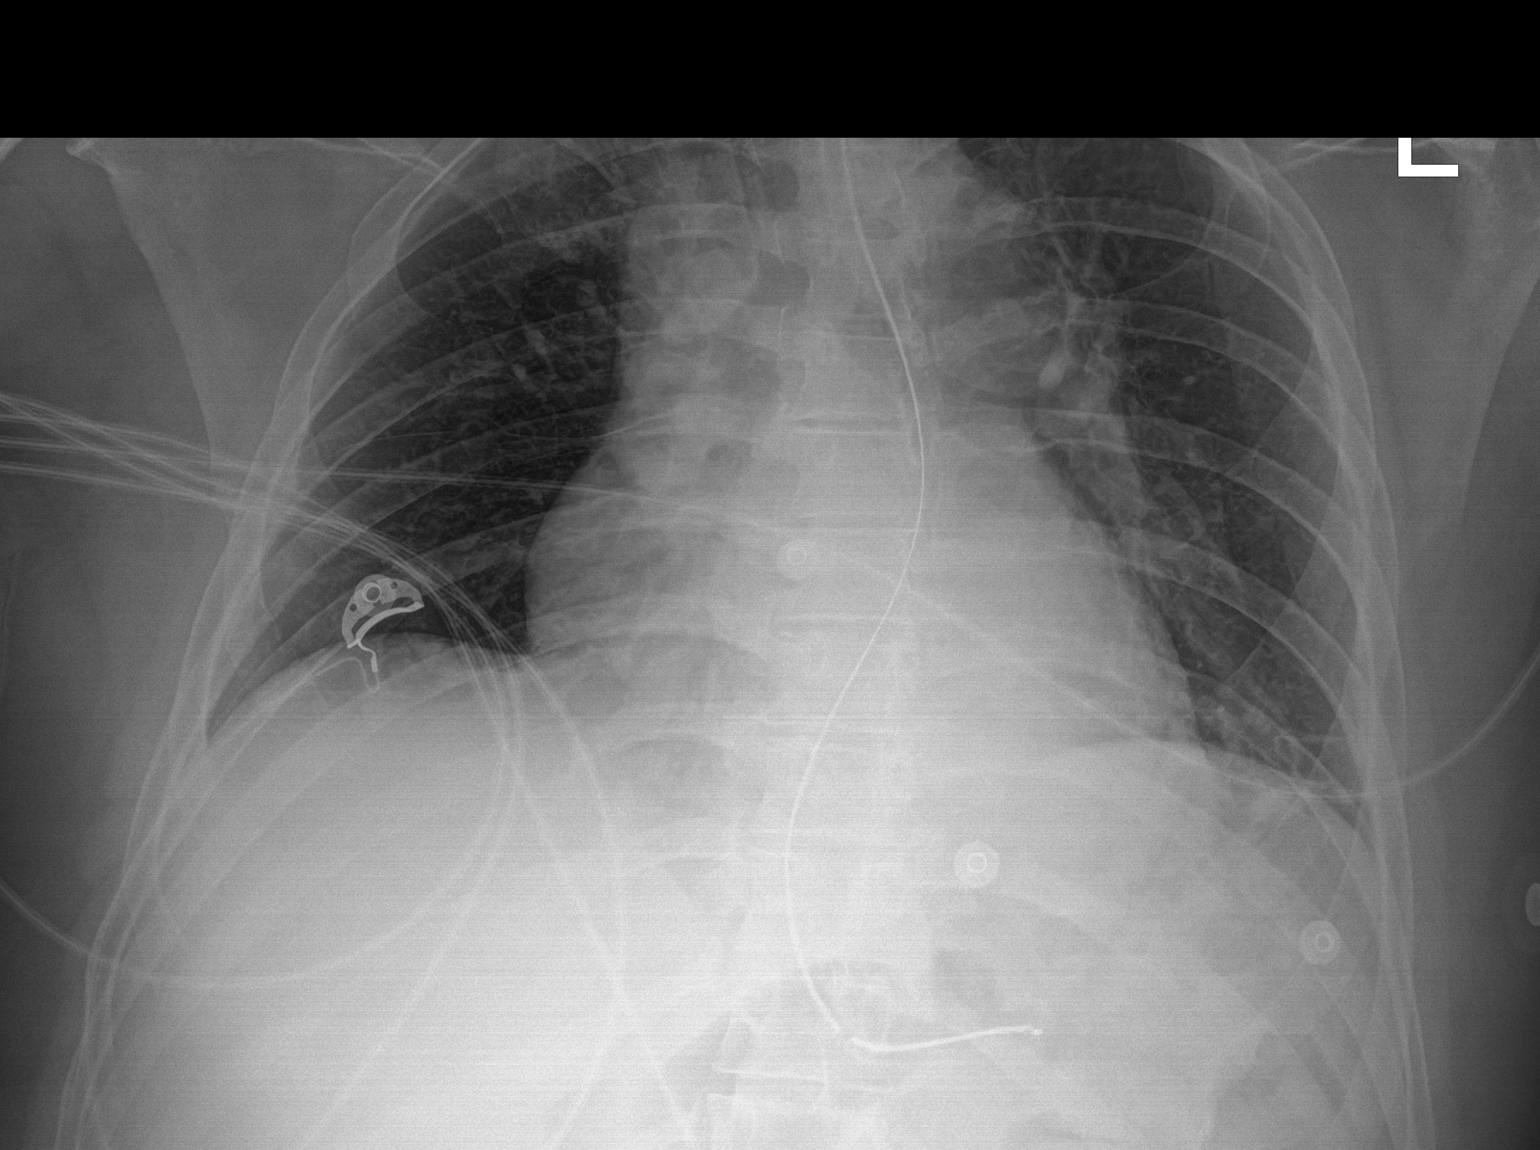

[1 of 1 positions shown; findings below may reference images not displayed]

FINDINGS: The endotracheal tube terminates approximately 6.1 cm above the
carina. The endotracheal tube is above the thoracic inlet. The
enteric tube extends below the left hemidiaphragm. The tip projects
over the gastric body. The heart size is enlarged. The lung volumes
are low. There is no pneumothorax. No acute osseous abnormality.
There are bibasilar airspace opacities.
IMPRESSION: 1. Endotracheal tube terminates above the thoracic inlet.
Repositioning should be considered.
2. Enteric tube projects over the gastric body.
3. Low lung volumes.
4. Bibasilar airspace opacities which may represent atelectasis,
infiltrate, or aspiration.

## 2019-12-19 ENCOUNTER — Other Ambulatory Visit: Payer: Self-pay | Admitting: Physician Assistant

## 2020-01-15 ENCOUNTER — Other Ambulatory Visit: Payer: Self-pay | Admitting: *Deleted

## 2020-01-15 ENCOUNTER — Other Ambulatory Visit: Payer: Self-pay

## 2020-01-15 DIAGNOSIS — I4892 Unspecified atrial flutter: Secondary | ICD-10-CM

## 2020-01-15 MED ORDER — FLECAINIDE ACETATE 100 MG PO TABS: 100.0000 mg | ORAL_TABLET | Freq: Three times a day (TID) | ORAL | 0 refills | Status: DC | PRN

## 2020-01-15 MED ORDER — DILTIAZEM HCL ER COATED BEADS 180 MG PO CP24: 180.0000 mg | ORAL_CAPSULE | Freq: Two times a day (BID) | ORAL | 11 refills | Status: DC

## 2020-01-25 ENCOUNTER — Encounter: Payer: Self-pay | Admitting: Physician Assistant

## 2020-01-25 ENCOUNTER — Telehealth: Payer: No Typology Code available for payment source | Admitting: Physician Assistant

## 2020-01-25 DIAGNOSIS — Z1152 Encounter for screening for COVID-19: Secondary | ICD-10-CM

## 2020-01-25 NOTE — Progress Notes (Signed)
E-Visit for [employer],   Based on the information you have given I do not think you need to quarantine or get tested currently. If you develop symptoms of COVID please refer to the testing information below and seek repeat evaluation.    Testing Information: The COVID-19 Community Testing sites will begin testing BY APPOINTMENT ONLY.  You can schedule online at [email or web address]  If you do not have access to a smart phone or computer you may call [phone] for an appointment.   Additional testing sites in the Community:  . For [facility] Testing sites in [facility]  [email or web address].[email or web address]  . For Pop-up testing sites in [place]  [email or web address]  . For Testing sites with regular hours [email or web address]  . For [facility] [email or web address]  . For [facility] [email or web address].dk  . For [facility] testing in [place] and [employer] [email or web address].dk  . For [facility] testing in [facility]   [email or web address]  For  more information about community testing call [phone]   Please quarantine yourself while awaiting your test results. Please stay home for a minimum of 10 days from the first day of illness with improving symptoms and you have had 24 hours of no fever (without the use of Tylenol (Acetaminophen) Motrin (Ibuprofen) or any fever reducing medication).  Also - Do not get tested prior to returning to work because once you have had a positive test the test can stay positive for more then a month in some cases.   You should wear a mask or cloth face  covering over your nose and mouth if you must be around other people or animals, including pets (even at home). Try to stay at least 6 feet away from other people. This will protect the people around you.  Please continue good preventive care measures, including:  frequent hand-washing, avoid touching your face, cover coughs/sneezes, stay out of crowds and keep a 6 foot distance from others.  COVID-19 is a respiratory illness with symptoms that are similar to the flu. Symptoms are typically mild to moderate, but there have been cases of severe illness and death due to the virus.   The following symptoms may appear 2-14 days after exposure: . Fever . Cough . Shortness of breath or difficulty breathing . Chills . Repeated shaking with chills . Muscle pain . Headache . Sore throat . New loss of taste or smell . Fatigue . Congestion or runny nose . Nausea or vomiting . Diarrhea  Go to the nearest hospital ED for assessment if fever/cough/breathlessness are severe or illness seems like a threat to life.  It is vitally important that if you feel that you have an infection such as this virus or any other virus that you stay home and away from places where you may spread it to others.  You should avoid contact with people age 43 and older.   You may also take acetaminophen (Tylenol) as needed for fever.  Reduce your risk of any infection by using the same precautions used for avoiding the common cold or flu:  Marland Kitchen Wash your hands often with soap and warm water for at least 20 seconds.  If soap and water are not readily available, use an alcohol-based hand sanitizer with at least 60% alcohol.  . If coughing or sneezing, cover your mouth and nose by coughing or sneezing into the elbow areas of your shirt or coat, into a tissue or into your sleeve (not your hands). Marland Kitchen  Avoid shaking hands with others and consider head nods or verbal greetings only. . Avoid touching your eyes, nose, or mouth with unwashed  hands.  . Avoid close contact with people who are sick. . Avoid places or events with large numbers of people in one location, like concerts or sporting events. . Carefully consider travel plans you have or are making. . If you are planning any travel outside or inside the [place], visit the CDC's Travelers' Health webpage for the latest health notices. . If you have some symptoms but not all symptoms, continue to monitor at home and seek medical attention if your symptoms worsen. . If you are having a medical emergency, call 911.  HOME CARE . Only take medications as instructed by your medical team. . Drink plenty of fluids and get plenty of rest. . A steam or ultrasonic humidifier can help if you have congestion.   GET HELP RIGHT AWAY IF YOU HAVE EMERGENCY WARNING SIGNS** FOR COVID-19. If you or someone is showing any of these signs seek emergency medical care immediately. Call 911 or proceed to your closest emergency facility if: . You develop worsening high fever. . Trouble breathing . Bluish lips or face . Persistent pain or pressure in the chest . New confusion . Inability to wake or stay awake . You cough up blood. . Your symptoms become more severe  **This list is not all possible symptoms. Contact your medical provider for any symptoms that are sever or concerning to you.  MAKE SURE YOU   Understand these instructions.  Will watch your condition.  Will get help right away if you are not doing well or get worse.  Your e-visit answers were reviewed by a board certified advanced clinical practitioner to complete your personal care plan.  Depending on the condition, your plan could have included both over the counter or prescription medications.  If there is a problem please reply once you have received a response from your provider.  Your safety is important to [place].  If you have drug allergies check your prescription carefully.    You can use MyChart to ask questions about  today's visit, request a non-urgent call back, or ask for a work or school excuse for 24 hours related to this e-Visit. If it has been greater than 24 hours you will need to follow up with your provider, or enter a new e-Visit to address those concerns. You will get an e-mail in the next two days asking about your experience.  I hope that your e-visit has been valuable and will speed your recovery. Thank you for using e-visits.  Greater than 5 minutes, yet less than 10 minutes of time have been spent researching, coordinating, and implementing care for this patient today

## 2020-01-27 ENCOUNTER — Encounter: Payer: Self-pay | Admitting: *Deleted

## 2020-01-27 NOTE — Progress Notes (Signed)
Patient ID: Mario Cameron, male   DOB: [birth year], 49 y.o.   MRN: [id number] ZIO patch monitor has not been returned to [employer] to be processed and enrollment has been changed to status Lost.  [employer] has attempted to contact patient three times to request return of monitor.  Order has been cancelled.

## 2020-02-20 ENCOUNTER — Encounter: Payer: Self-pay | Admitting: Physician Assistant

## 2020-05-16 ENCOUNTER — Ambulatory Visit (HOSPITAL_COMMUNITY)
Admission: EM | Admit: 2020-05-16 | Discharge: 2020-05-16 | Disposition: A | Discharge status: Home / Self Care | Payer: No Typology Code available for payment source

## 2020-05-16 ENCOUNTER — Other Ambulatory Visit: Payer: Self-pay

## 2020-05-16 ENCOUNTER — Encounter: Payer: Self-pay | Admitting: Physician Assistant

## 2020-05-16 ENCOUNTER — Encounter (HOSPITAL_COMMUNITY): Payer: Self-pay | Admitting: Emergency Medicine

## 2020-05-16 ENCOUNTER — Emergency Department (HOSPITAL_COMMUNITY)
Admission: EM | Admit: 2020-05-16 | Discharge: 2020-05-16 | Discharge status: Against Medical Advice | Payer: No Typology Code available for payment source | Attending: Emergency Medicine | Admitting: Emergency Medicine

## 2020-05-16 DIAGNOSIS — H5340 Unspecified visual field defects: Secondary | ICD-10-CM | POA: Insufficient documentation

## 2020-05-16 MED ORDER — FLUORESCEIN SODIUM 1 MG OP STRP
1.0000 | ORAL_STRIP | Freq: Once | OPHTHALMIC | Status: AC
  Administered 2020-05-16: 1 via OPHTHALMIC
  Filled 2020-05-16: qty 1

## 2020-05-16 MED ORDER — TETRACAINE HCL 0.5 % OP SOLN
2.0000 [drp] | Freq: Once | OPHTHALMIC | Status: AC
  Administered 2020-05-16: 2 [drp] via OPHTHALMIC
  Filled 2020-05-16: qty 4

## 2020-05-16 NOTE — ED Triage Notes (Signed)
Pt reports vision to R eye cloudy since Friday.  Reports floaters and "black splatters" in vision.  States R eye is sore today.

## 2020-05-16 NOTE — ED Notes (Signed)
Called pt in an attempt to discuss c/c of "possibly detached retina" to advise pt to go to ER if he feels that is a probable injury. Voicemail leaving [facility], RN name and desire to talk left on pt's phone number provided.

## 2020-05-16 NOTE — ED Notes (Signed)
Patient is being discharged from the Urgent Care and sent to the Emergency Department via further evaluation  . Per provider Dorene Grebe , patient is in need of higher level of care due to detached retina  Patient is aware and verbalizes understanding of plan of care. There were no vitals filed for this visit.
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
## 2020-05-16 NOTE — Discharge Instructions (Addendum)
You have chosen to leave AGAINST MEDICAL ADVICE. This means the recommended work-up was unable to be completed. Should you change your mind, you are always welcome and encouraged to return to the ED. An appointment has been made for you at [facility] with Dr. Sherrine Maples tomorrow, Monday, July 12 at 12:30 PM. Please return to the emergency department for worsening symptoms, eye pain, swelling around the eye, worsening vision loss, or any other major concerns.

## 2020-05-18 ENCOUNTER — Encounter (INDEPENDENT_AMBULATORY_CARE_PROVIDER_SITE_OTHER): Payer: Self-pay | Admitting: Ophthalmology

## 2020-05-21 ENCOUNTER — Other Ambulatory Visit: Payer: Self-pay | Admitting: Physician Assistant

## 2020-05-27 ENCOUNTER — Other Ambulatory Visit: Payer: Self-pay

## 2020-05-27 ENCOUNTER — Ambulatory Visit (HOSPITAL_COMMUNITY)
Admission: EM | Admit: 2020-05-27 | Discharge: 2020-05-27 | Disposition: A | Discharge status: Home / Self Care | Payer: No Payment, Other | Attending: Psychiatry | Admitting: Psychiatry

## 2020-05-27 DIAGNOSIS — F3181 Bipolar II disorder: Secondary | ICD-10-CM | POA: Insufficient documentation

## 2020-05-27 DIAGNOSIS — Z79899 Other long term (current) drug therapy: Secondary | ICD-10-CM | POA: Insufficient documentation

## 2020-05-27 DIAGNOSIS — R251 Tremor, unspecified: Secondary | ICD-10-CM | POA: Insufficient documentation

## 2020-05-27 DIAGNOSIS — F333 Major depressive disorder, recurrent, severe with psychotic symptoms: Secondary | ICD-10-CM

## 2020-05-27 DIAGNOSIS — F419 Anxiety disorder, unspecified: Secondary | ICD-10-CM | POA: Insufficient documentation

## 2020-05-27 DIAGNOSIS — Z915 Personal history of self-harm: Secondary | ICD-10-CM | POA: Insufficient documentation

## 2020-05-27 NOTE — ED Provider Notes (Signed)
Behavioral Health Medical Screening Exam  Mario Cameron is a 49 y.o. male who presents voluntarily to [facility] health center for walk-in assessment. Patient reports he was previously followed by [facility] outpatient psychiatry but is here today to facilitate transition to [facility] behavioral health center related to the fact that he is currently uninsured and can no longer be seen by [employer].  Patient denies suicidal ideations currently. Patient endorses history of suicide attempts x3, last in 2020. Patient denies self-harm behaviors. Patient denies homicidal ideations. Patient denies auditory and visual hallucinations. There is no indication that patient is responding to internal stimuli, nor any delusional thought content.  Patient has history of major depressive disorder, anxiety and bipolar 2 disorder. Patient reports compliance with home medications including Effexor, Lamictal, BuSpar, hydroxyzine PRN and Ambien. Patient reports he is currently stable on medications. Patient reports he is currently seen twice monthly for group therapy.  Patient resides in [place], alone. Patient denies access to weapons. Patient currently employed as a care provider. Patient denies alcohol and substance use.  TTS counselor scheduled patient in outpatient area of [facility] for psychiatry and therapy appointments. Patient offered support and encouragement.   Total Time spent with patient: 20 minutes  Psychiatric Specialty Exam  Presentation  General Appearance:Appropriate for Environment  Eye Contact:Fair  Speech:Clear and Coherent;Normal Rate  Speech Volume:Normal  Handedness:Right   Mood and Affect  Mood:Depressed  Affect:Appropriate;Congruent   Thought Process  Thought Processes:Coherent;Goal Directed  Descriptions of Associations:Intact  Orientation:Full (Time, Place and Person)  Thought  Content:WDL  Hallucinations:None  Ideas of Reference:None  Suicidal Thoughts:No  Homicidal Thoughts:No   Sensorium  Memory:Immediate Good;Recent Good;Remote Good  Judgment:Good  Insight:Good   Executive Functions  Concentration:Good  Attention Span:Good  Recall:Good  Fund of Knowledge:Good  Language:Good   Psychomotor Activity  Psychomotor Activity:Tremor   Assets  Assets:Communication Skills;Desire for Improvement;Financial Resources/Insurance;Intimacy;Housing;Leisure Time;Physical Health;Resilience;Social Support;Talents/Skills;Transportation   Sleep  Sleep:Fair  Number of hours: No data recorded  Physical Exam: Physical Exam Vitals and nursing note reviewed.  Constitutional:      Appearance: He is well-developed.  HENT:     Head: Normocephalic.  Cardiovascular:     Rate and Rhythm: Normal rate.  Pulmonary:     Effort: Pulmonary effort is normal.  Neurological:     Mental Status: He is alert and oriented to person, place, and time.  Psychiatric:        Attention and Perception: Attention and perception normal.        Mood and Affect: Mood is depressed.        Speech: Speech normal.        Behavior: Behavior normal. Behavior is cooperative.        Thought Content: Thought content normal.        Cognition and Memory: Cognition and memory normal.        Judgment: Judgment normal.    Review of Systems  Constitutional: Negative.   HENT: Negative.   Eyes: Negative.   Respiratory: Negative.   Cardiovascular: Negative.   Gastrointestinal: Negative.   Genitourinary: Negative.   Musculoskeletal: Negative.   Skin: Negative.   Neurological: Negative.   Endo/Heme/Allergies: Negative.   Psychiatric/Behavioral: Positive for depression.   Blood pressure (!) 134/94, pulse 83, temperature 98 F (36.7 C), temperature source Oral, resp. rate 18, height 5\' 10"  (1.778 m), weight (!) 210 lb (95.3 kg), SpO2 99 %. Body mass index is 30.13  kg/m.  Musculoskeletal: Strength & Muscle Tone: within  normal limits Gait & Station: normal Patient leans: N/A   Recommendations: Patient reviewed with Dr. Nelly Rout. Follow-up with outpatient psychiatry at [facility], appointments provided.  Based on my evaluation the patient does not appear to have an emergency medical condition.  Patrcia Dolly, FNP 05/27/2020, 5:00 PM

## 2020-05-27 NOTE — Progress Notes (Signed)
Mario Cameron received his AVS, his questions were answered and he retrieved his personal belongings. nHe was escorted to the lobby without incident.

## 2020-05-27 NOTE — Discharge Instructions (Addendum)

## 2020-05-28 ENCOUNTER — Other Ambulatory Visit: Payer: Self-pay

## 2020-05-28 ENCOUNTER — Ambulatory Visit (INDEPENDENT_AMBULATORY_CARE_PROVIDER_SITE_OTHER): Payer: No Payment, Other | Admitting: Licensed Clinical Social Worker

## 2020-05-28 DIAGNOSIS — F332 Major depressive disorder, recurrent severe without psychotic features: Secondary | ICD-10-CM | POA: Diagnosis not present
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
## 2020-06-10 ENCOUNTER — Ambulatory Visit (INDEPENDENT_AMBULATORY_CARE_PROVIDER_SITE_OTHER): Payer: No Payment, Other | Admitting: Psychiatry

## 2020-06-10 ENCOUNTER — Encounter (HOSPITAL_COMMUNITY): Payer: Self-pay | Admitting: Psychiatry

## 2020-06-10 ENCOUNTER — Other Ambulatory Visit: Payer: Self-pay

## 2020-06-10 DIAGNOSIS — F419 Anxiety disorder, unspecified: Secondary | ICD-10-CM

## 2020-06-10 DIAGNOSIS — F3181 Bipolar II disorder: Secondary | ICD-10-CM | POA: Diagnosis not present

## 2020-06-10 MED ORDER — PRAZOSIN HCL 5 MG PO CAPS: 5.0000 mg | ORAL_CAPSULE | Freq: Every day | ORAL | 2 refills | Status: DC

## 2020-06-10 MED ORDER — LAMOTRIGINE 100 MG PO TABS: 100.0000 mg | ORAL_TABLET | Freq: Two times a day (BID) | ORAL | 2 refills | Status: DC

## 2020-06-10 MED ORDER — HYDROXYZINE HCL 25 MG PO TABS: 25.0000 mg | ORAL_TABLET | Freq: Three times a day (TID) | ORAL | 2 refills | Status: DC | PRN

## 2020-06-10 MED ORDER — ZOLPIDEM TARTRATE 5 MG PO TABS: 5.0000 mg | ORAL_TABLET | Freq: Every evening | ORAL | 2 refills | Status: DC | PRN

## 2020-06-10 MED ORDER — BUSPIRONE HCL 15 MG PO TABS: 15.0000 mg | ORAL_TABLET | Freq: Three times a day (TID) | ORAL | 2 refills | Status: DC

## 2020-06-10 MED ORDER — VENLAFAXINE HCL ER 150 MG PO CP24: 300.0000 mg | ORAL_CAPSULE | Freq: Every day | ORAL | 2 refills | Status: DC
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
## 2020-06-23 ENCOUNTER — Ambulatory Visit (HOSPITAL_COMMUNITY): Payer: No Payment, Other | Admitting: Licensed Clinical Social Worker

## 2020-07-01 ENCOUNTER — Telehealth (HOSPITAL_COMMUNITY): Payer: Self-pay | Admitting: Psychiatry

## 2020-07-01 ENCOUNTER — Other Ambulatory Visit (HOSPITAL_COMMUNITY): Payer: Self-pay | Admitting: Psychiatry

## 2020-07-01 DIAGNOSIS — F3181 Bipolar II disorder: Secondary | ICD-10-CM

## 2020-07-01 MED ORDER — ZOLPIDEM TARTRATE 10 MG PO TABS: 10.0000 mg | ORAL_TABLET | Freq: Every evening | ORAL | 1 refills | Status: DC | PRN

## 2020-07-01 NOTE — Telephone Encounter (Signed)
Provider called patient and he noted that he has been unable to sleep for the last four days due to increasing anxiety surrounding losing his site in his right eye. Patient notes that he is unaware of how to cope with this current stressor. Provider increased Ambien 5 mg to Ambien 10 mg to help manage sleep. Ambien dose will be reduced when patient sleep patterns are stable. Patient also instructed to follow up with outpatient therapist to help cope with life stressors. He endorsed understanding and agreed.

## 2020-07-02 ENCOUNTER — Encounter: Payer: Self-pay | Admitting: Physician Assistant

## 2020-07-02 NOTE — Telephone Encounter (Signed)
FYI

## 2020-07-07 ENCOUNTER — Ambulatory Visit (INDEPENDENT_AMBULATORY_CARE_PROVIDER_SITE_OTHER): Payer: No Payment, Other | Admitting: Licensed Clinical Social Worker

## 2020-07-07 ENCOUNTER — Other Ambulatory Visit: Payer: Self-pay

## 2020-07-07 DIAGNOSIS — F331 Major depressive disorder, recurrent, moderate: Secondary | ICD-10-CM | POA: Diagnosis not present

## 2020-07-08 ENCOUNTER — Encounter: Payer: Self-pay | Admitting: Physician Assistant

## 2020-07-08 NOTE — Progress Notes (Signed)
THERAPIST PROGRESS NOTE  Session Time: 55 min Virtual Visit via Video Note  I connected with Emillio Boehlke on 07/07/20 at  3:00 PM EDT by a video enabled telemedicine application and verified that I am speaking with the correct person using two identifiers.  Location: Patient: Home Provider: [facility]   I discussed the limitations of evaluation and management by telemedicine and the availability of in person appointments. The patient expressed understanding and agreed to proceed. I discussed the assessment and treatment plan with the patient. The patient was provided an opportunity to ask questions and all were answered. The patient agreed with the plan and demonstrated an understanding of the instructions.   I provided 55 minutes of non-face-to-face time during this encounter.   Participation Level: Active  Behavioral Response: CasualAlertAnxious and Depressed  Type of Therapy: Individual Therapy  Treatment Goals addressed: Coping  Interventions: Supportive  Summary: Ram Haugan is a 49 y.o. male who presents with hx of MDD. (Chart review prior to session) This date pt signs on for video session per his preference. This is the first session since initial session. Pt is noted to have a patch over his R eye. Clinician first assessed for overall feelings of depression. Pt reports his severe depression that comes every year in the beginning of the summer "started to lift around Aug 10th" and says it is usually around that time. He is on meds as prescribed. 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 Pt describes the details of events leading up to this devastating dx. He reports he was having eye problems/symptoms in early July and saw an opthalmologist in [place] who it appears ultimately misdiagnosed him. Pt reports he made a driving trip to [place] to visit his fam leaving ~ 8/12 and got back to [facility] 8/22.  He states his cat, Soofy, went with him which made the trip more tolerable. Pt reports while with his parents his father, with whom he has a close relationship, insisted pt see his eye Dr. 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 Told him there was significant scar tissue that had formed. Pt denies physical pain. Appoint for [facility] facilitated. Pt just went to [facility] last wk. He was assessed, provided treatment options and told the damage was done given the length of time this problem has been going on. He was told while there that even with tx he would be legally blind in R eye. Pt reflects on the many impacts he has already noted and what a changing life event this is. He describes this as a tragedy and a trauma. Pt is waiting to see if he will be sponsored by [employer] for payment of surgery at [facility]. He was told he will probably be approved and at this stage it would not matter if he had to wait for surgery. He talked to his father by phone after being told the news he would remain legally blind in that eye. He reports father dropped the phone, mother picked it up and father was reportedly too distraught to return to the line. Pt states "It almost hurt worse to have put him in that position". He has talked with father since and they are all trying to process this news. Pt denies SI. Pt talks about his overall poor self image and appearance prior to bariatric  surgery. He states the only two things he has ever gotten complements about are his eyes and his teeth. LCSW provided much active listening with validation and processing of thoughts/feelings. Counseled on the fact this is a major loss that will include the grief process. Talked about initial stage of shock and denial. Pt states he was in denial thinking everything would be fine once he got care at [facility] and now is in shock he will remain legally blind despite tx. Pt reports he will be working up until he has  surgery. He states his Sep rent is paid, all bills currently caught up, has food stamps ($210 monthly). Pt reports he has gotten all needed info into attorney for Disability claim as of today. He does not know what to expect about recovery from surgery. Encouraged pt to find out if he will need someone to stay with him post surgery since he lives alone. Pt acknowledges and says he knows he has a friend who would do that for him if needed and thanked LCSW for mentioning it since he had not thought of same. LCSW reviewed poc including scheduling prior to close of session. Sessions will all be virtual for now. Pt states appreciation for care.        Suicidal/Homicidal: Nowithout intent/plan  Therapist Response: Pt remains receptive to care. Plan: Return again in 1 week.  Diagnosis: Axis I: MDD, recurrent, moderate    Axis II: Deferred  [place] Sink, LCSW 07/08/2020

## 2020-07-09 ENCOUNTER — Telehealth: Payer: Self-pay | Admitting: Cardiology

## 2020-07-09 NOTE — Telephone Encounter (Signed)
Pt has spoken to medical records. Please see updated encounter.
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
## 2020-07-21 ENCOUNTER — Ambulatory Visit (HOSPITAL_COMMUNITY): Payer: No Payment, Other | Admitting: Licensed Clinical Social Worker

## 2020-08-04 ENCOUNTER — Ambulatory Visit (HOSPITAL_COMMUNITY): Payer: No Payment, Other | Admitting: Licensed Clinical Social Worker

## 2020-08-10 ENCOUNTER — Encounter (HOSPITAL_COMMUNITY): Payer: No Payment, Other | Admitting: Psychiatry

## 2020-08-18 ENCOUNTER — Ambulatory Visit (INDEPENDENT_AMBULATORY_CARE_PROVIDER_SITE_OTHER): Payer: No Payment, Other | Admitting: Licensed Clinical Social Worker

## 2020-08-18 ENCOUNTER — Other Ambulatory Visit: Payer: Self-pay

## 2020-08-18 DIAGNOSIS — F332 Major depressive disorder, recurrent severe without psychotic features: Secondary | ICD-10-CM | POA: Diagnosis not present

## 2020-08-19 NOTE — Progress Notes (Signed)
THERAPIST PROGRESS NOTE Virtual Visit via Video Note  I connected with Mario Cameron on 08/18/20 at  3:00 PM EDT by a video enabled telemedicine application and verified that I am speaking with the correct person using two identifiers.  Location: Patient: Home Provider: [facility]   I discussed the limitations of evaluation and management by telemedicine and the availability of in person appointments. The patient expressed understanding and agreed to proceed. I discussed the assessment and treatment plan with the patient. The patient was provided an opportunity to ask questions and all were answered. The patient agreed with the plan and demonstrated an understanding of the instructions.  I provided 55 minutes of non-face-to-face time during this encounter.   Participation Level: Active  Behavioral Response: CasualAlertAngry, Depressed and Dysphoric  Type of Therapy: Individual Therapy  Treatment Goals addressed: Coping  Interventions: Solution Focused and Supportive  Summary: Mario Cameron is a 49 y.o. male who presents with hx of MDD. This date pt signs on for video session per his preference. LCSW assessed for overall status of loss of sight and if he was approved for payment of surgery at [facility] by division of blind. Pt reports he was financially approved and did have the surgery on 9/24 saying there were 5 different procedures done with surgery being 4+ hours. Pt states his boss took him and he did not need anyone to stay with him post op. He reports he was out of work 2 wks and was supposed to do very little during that time. Pt reports significant pain with inadequate pain management. He was given drops and told to take tylenol. Pt has now started back to work. 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 Client does not require any heavy lifting r/t to his caregiving needs. He states his return to work is okay and he really has no choice d/t bills. He reports he was 11 days late with Oct rent and  does not know how he will pay Nov. LCSW provided info on new funding for [employer] for rent/utilites which pt will f/u on. LCSW assessed depression rating on scale of 0-10 with 10 the worst. Pt states "9". He states he is very frustrated and has now become angry. Pt advises he has never been an angry person with exception of being angry with himself. He states he is very irritable with "constant negative thoughts". He states "It takes so much effort to do anything". He reports he has not been a part of his depression support group through [facility] for at least two wks because he becomes "too pissed off" . He is isolating as much as possible d/t irritablity. He does say he is not angry with his client when asked. Pt has not explored what other services he may be able to get from division of the blind and low vision r/t equip or support. Encouraged him to take this step. He declines anger management group starting Oct 30 saying he is too self conscious and does not need to be around other angry people. LCSW provided additional education on grief and the grief process including anger. Assessed for family dynamics/stress. Pt provides some details and remains stressed by mother and worried for father. Sister is in a recovery program for her substance abuse issues. He feels helpless r/t fam circumstances and is worried his father may not live long. Father has been in ER 4 times in the last 8 wks. LCSW assisted to process thoughts and feelings with practical recommendations  for interventions for father. Assessed for coping strategies. Pt states he is either screaming in a pillow or crying for relief. He states he does put on some YouTube Videos at times. Recommended the Daily Calm he agrees to investigate as pt states he sits in silence with his thoughts most of the time when home. LCSW reviewed poc with pt's verbal acceptance prior to close of session. Pt states appreciation for care.   Suicidal/Homicidal:  Yeswithout intent/plan  Therapist Response: Pt remains receptive to care.  Plan: Return again in 2 weeks.  Diagnosis: Axis I: Major Depression, Recurrent severe    Axis II: Deferred  [place] Sink, LCSW 08/19/2020

## 2020-08-20 ENCOUNTER — Other Ambulatory Visit: Payer: Self-pay | Admitting: Physician Assistant

## 2020-08-26 ENCOUNTER — Other Ambulatory Visit: Payer: Self-pay

## 2020-08-26 ENCOUNTER — Encounter (HOSPITAL_COMMUNITY): Payer: Self-pay | Admitting: Psychiatry

## 2020-08-26 ENCOUNTER — Telehealth (INDEPENDENT_AMBULATORY_CARE_PROVIDER_SITE_OTHER): Payer: No Payment, Other | Admitting: Psychiatry

## 2020-08-26 DIAGNOSIS — F3181 Bipolar II disorder: Secondary | ICD-10-CM

## 2020-08-26 DIAGNOSIS — F419 Anxiety disorder, unspecified: Secondary | ICD-10-CM

## 2020-08-26 MED ORDER — ZOLPIDEM TARTRATE 10 MG PO TABS: 10.0000 mg | ORAL_TABLET | Freq: Every evening | ORAL | 1 refills | Status: DC | PRN

## 2020-08-26 MED ORDER — BUSPIRONE HCL 15 MG PO TABS: 15.0000 mg | ORAL_TABLET | Freq: Three times a day (TID) | ORAL | 2 refills | Status: DC

## 2020-08-26 MED ORDER — VENLAFAXINE HCL ER 150 MG PO CP24: 300.0000 mg | ORAL_CAPSULE | Freq: Every day | ORAL | 2 refills | Status: DC

## 2020-08-26 MED ORDER — PRAZOSIN HCL 5 MG PO CAPS: 5.0000 mg | ORAL_CAPSULE | Freq: Every day | ORAL | 2 refills | Status: DC

## 2020-08-26 MED ORDER — HYDROXYZINE HCL 25 MG PO TABS: 25.0000 mg | ORAL_TABLET | ORAL | 2 refills | Status: DC | PRN

## 2020-08-26 MED ORDER — LAMOTRIGINE 100 MG PO TABS: 100.0000 mg | ORAL_TABLET | Freq: Two times a day (BID) | ORAL | 2 refills | Status: DC
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
## 2020-09-13 ENCOUNTER — Other Ambulatory Visit: Payer: Self-pay

## 2020-09-13 ENCOUNTER — Ambulatory Visit (INDEPENDENT_AMBULATORY_CARE_PROVIDER_SITE_OTHER): Payer: No Payment, Other | Admitting: Licensed Clinical Social Worker

## 2020-09-13 DIAGNOSIS — F332 Major depressive disorder, recurrent severe without psychotic features: Secondary | ICD-10-CM | POA: Diagnosis not present

## 2020-09-14 NOTE — Progress Notes (Signed)
THERAPIST PROGRESS NOTE   Virtual Visit via Video Note  I connected with Mario Cameron on 09/13/20 at  3:00 PM EST by a video enabled telemedicine application and verified that I am speaking with the correct person using two identifiers.  Location: Patient: Home Provider: [facility]   I discussed the limitations of evaluation and management by telemedicine and the availability of in person appointments. The patient expressed understanding and agreed to proceed. I discussed the assessment and treatment plan with the patient. The patient was provided an opportunity to ask questions and all were answered. The patient agreed with the plan and demonstrated an understanding of the instructions.  I provided 55 minutes of non-face-to-face time during this encounter.  Participation Level: Active  Behavioral Response: CasualAlertDepressed and Dysphoric  Type of Therapy: Individual Therapy  Treatment Goals addressed: Communication: Depression and Coping  Interventions: Motivational Interviewing, Supportive and Other: Grief  Summary: Mario Cameron is a 49 y.o. male who presents with hx of MDD. This date pt signs on for video session per his preference. LCSW assessed for any significant changes since last session. Pt states "Where do I begin?". Pt advises his father had triple bypass surgery days after last session and provides details. He states in a timeline that he started have floaters and light flashes in is good eye on Oct 19th. He states he drove himself to [facility] ER. 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 He was kept for tx and released that night. Reports he was able to drive self home. He had f/u appt on 10/26. He further reports both eyes checked. Pt advised his good eye detached again and the check of the other eye reveals the prior surgery done "did not hold" and he will need to have surgery again at some point. Pt was kept for laser correction of  which clinician wanted to do after all other pt's seen. While in waiting room pt states he got a message from his boss that he was going to be terminated from his job. Pt provides many details of these overwhelming experiences and his feelings with "new raw grief". F/u appt at [facility] last wk reveals tx of good eye is "holding". He will still need repeat surgery on other eye that may be done before Thanksgiving. LCSW assisted to process thoughts/feelings/losses/fears. Pt already struggling financially. LCSW assessed for Nov rent. He states he did apply for rent assist LCSW told him about last session and expects to be approved. He has food stamps and is getting groceries delivered. Believes he will get help from [employer] with utilities. He reports how overwhelming all of the necessary f/u for services is since his physical changes and new job loss when he feels so poorly. He advises his father who would normally be the main person he would talk to knows nothing about his current situation d/t recovery from bypass. He states his mother is more understanding and supportive with physical disability and has wired him some money. Had a good talk with friend "Mario Cameron" he has known since 1996 he states was helpful. Pt states "If I had a previous life I must have been a really terrible person and paying for it now". This was addressed in some detail and pt shares info about a "spiritulist" he has been following.  Remainder of session devoted to coping as pt is not supposed to be doing much of anything. He admits to boredom and thoughts of suicide  with no intent. Reminded pt of 24/7 crisis unit at this facility. He agrees to use prn. He has not rejoined support group. Pt is turning the TV and music on in intervals. Listening to podcasts. Has cat for companionship. He is unable to read which he enjoys. LCSW introduced books on tape. Pt states he recently heard of this but has not looked into it but will to help with positive  distraction. He states he has also found a "non THC" product that is giving him some peace and calm. LCSW reviewed poc with pt's verbal agreement prior to close of session. Pt states appreciation for care and support.      Suicidal/Homicidal: Yeswithout intent/plan  Therapist Response: Pt remains receptive to care.  Plan: Return again in ~ 3 weeks.  Diagnosis: Axis I: Major Depression, Recurrent severe    Axis II: Deferred  [place] Sink, LCSW 09/14/2020

## 2020-10-07 ENCOUNTER — Ambulatory Visit (HOSPITAL_COMMUNITY): Payer: No Payment, Other | Admitting: Licensed Clinical Social Worker

## 2020-10-07 ENCOUNTER — Other Ambulatory Visit: Payer: Self-pay

## 2020-10-26 ENCOUNTER — Other Ambulatory Visit: Payer: Self-pay

## 2020-10-26 ENCOUNTER — Telehealth (INDEPENDENT_AMBULATORY_CARE_PROVIDER_SITE_OTHER): Payer: No Payment, Other | Admitting: Psychiatry

## 2020-10-26 ENCOUNTER — Encounter (HOSPITAL_COMMUNITY): Payer: Self-pay | Admitting: Psychiatry

## 2020-10-26 DIAGNOSIS — F3181 Bipolar II disorder: Secondary | ICD-10-CM | POA: Diagnosis not present

## 2020-10-26 DIAGNOSIS — F419 Anxiety disorder, unspecified: Secondary | ICD-10-CM | POA: Diagnosis not present

## 2020-10-26 MED ORDER — VENLAFAXINE HCL ER 150 MG PO CP24: 300.0000 mg | ORAL_CAPSULE | Freq: Every day | ORAL | 2 refills | Status: DC

## 2020-10-26 MED ORDER — BUSPIRONE HCL 15 MG PO TABS: 15.0000 mg | ORAL_TABLET | Freq: Three times a day (TID) | ORAL | 2 refills | Status: DC

## 2020-10-26 MED ORDER — HYDROXYZINE HCL 25 MG PO TABS: 25.0000 mg | ORAL_TABLET | ORAL | 2 refills | Status: DC | PRN

## 2020-10-26 MED ORDER — LAMOTRIGINE 100 MG PO TABS: 100.0000 mg | ORAL_TABLET | Freq: Two times a day (BID) | ORAL | 2 refills | Status: DC

## 2020-10-26 MED ORDER — PRAZOSIN HCL 5 MG PO CAPS: 5.0000 mg | ORAL_CAPSULE | Freq: Every day | ORAL | 2 refills | Status: DC

## 2020-10-26 MED ORDER — ZOLPIDEM TARTRATE 10 MG PO TABS: 10.0000 mg | ORAL_TABLET | Freq: Every evening | ORAL | 1 refills | Status: DC | PRN
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
## 2020-11-04 ENCOUNTER — Telehealth (HOSPITAL_COMMUNITY): Payer: Self-pay | Admitting: Licensed Clinical Social Worker

## 2020-11-04 ENCOUNTER — Ambulatory Visit (HOSPITAL_COMMUNITY): Payer: No Payment, Other | Admitting: Licensed Clinical Social Worker

## 2020-11-04 ENCOUNTER — Other Ambulatory Visit: Payer: Self-pay

## 2020-11-04 NOTE — Telephone Encounter (Signed)
LCSW sent text link for video session per schedule today. When pt failed to sign on LCSW called pt. Call rang and then went to vm. LCSW lvm with details re purpose of call. Requested call back to clinician's office # to leave message re pt's current status. Last appt cancelled by pt d/t emergency eye surgery. Office # provided on pt's vm.

## 2020-11-14 ENCOUNTER — Other Ambulatory Visit: Payer: Self-pay | Admitting: Physician Assistant

## 2020-12-27 ENCOUNTER — Encounter (HOSPITAL_COMMUNITY): Payer: Self-pay | Admitting: Psychiatry

## 2020-12-27 ENCOUNTER — Telehealth (INDEPENDENT_AMBULATORY_CARE_PROVIDER_SITE_OTHER): Payer: No Payment, Other | Admitting: Psychiatry

## 2020-12-27 ENCOUNTER — Other Ambulatory Visit: Payer: Self-pay

## 2020-12-27 DIAGNOSIS — F419 Anxiety disorder, unspecified: Secondary | ICD-10-CM

## 2020-12-27 DIAGNOSIS — F3181 Bipolar II disorder: Secondary | ICD-10-CM | POA: Diagnosis not present

## 2020-12-27 MED ORDER — PRAZOSIN HCL 5 MG PO CAPS: 5.0000 mg | ORAL_CAPSULE | Freq: Every day | ORAL | 2 refills | Status: DC

## 2020-12-27 MED ORDER — VENLAFAXINE HCL ER 150 MG PO CP24: 300.0000 mg | ORAL_CAPSULE | Freq: Every day | ORAL | 2 refills | Status: DC

## 2020-12-27 MED ORDER — ZOLPIDEM TARTRATE 10 MG PO TABS: 10.0000 mg | ORAL_TABLET | Freq: Every evening | ORAL | 1 refills | Status: DC | PRN

## 2020-12-27 MED ORDER — BUSPIRONE HCL 30 MG PO TABS: 30.0000 mg | ORAL_TABLET | Freq: Three times a day (TID) | ORAL | 2 refills | Status: DC

## 2020-12-27 MED ORDER — LAMOTRIGINE 100 MG PO TABS: 100.0000 mg | ORAL_TABLET | Freq: Two times a day (BID) | ORAL | 2 refills | Status: DC

## 2020-12-27 MED ORDER — HYDROXYZINE HCL 25 MG PO TABS: 25.0000 mg | ORAL_TABLET | Freq: Three times a day (TID) | ORAL | 2 refills | Status: DC
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
## 2020-12-28 ENCOUNTER — Other Ambulatory Visit (HOSPITAL_COMMUNITY): Payer: Self-pay | Admitting: Psychiatry

## 2020-12-28 DIAGNOSIS — F3181 Bipolar II disorder: Secondary | ICD-10-CM

## 2021-01-15 ENCOUNTER — Encounter: Payer: Self-pay | Admitting: Physician Assistant

## 2021-01-17 ENCOUNTER — Other Ambulatory Visit: Payer: Medicaid Other

## 2021-01-17 ENCOUNTER — Encounter: Payer: Self-pay | Admitting: Physician Assistant

## 2021-01-17 ENCOUNTER — Other Ambulatory Visit: Payer: Self-pay

## 2021-01-17 ENCOUNTER — Telehealth (INDEPENDENT_AMBULATORY_CARE_PROVIDER_SITE_OTHER): Payer: Medicaid Other | Admitting: Physician Assistant

## 2021-01-17 VITALS — Ht 70.0 in | Wt 210.0 lb

## 2021-01-17 DIAGNOSIS — R197 Diarrhea, unspecified: Secondary | ICD-10-CM

## 2021-01-17 LAB — CBC WITH DIFFERENTIAL/PLATELET
Basophils Absolute: 0 10*3/uL (ref 0.0–0.1)
Basophils Relative: 0.7 % (ref 0.0–3.0)
Eosinophils Absolute: 0 10*3/uL (ref 0.0–0.7)
Eosinophils Relative: 0.7 % (ref 0.0–5.0)
HCT: 39.1 % (ref 39.0–52.0)
Hemoglobin: 13.4 g/dL (ref 13.0–17.0)
Lymphocytes Relative: 37.5 % (ref 12.0–46.0)
Lymphs Abs: 1.9 10*3/uL (ref 0.7–4.0)
MCHC: 34.2 g/dL (ref 30.0–36.0)
MCV: 95.5 fl (ref 78.0–100.0)
Monocytes Absolute: 0.5 10*3/uL (ref 0.1–1.0)
Monocytes Relative: 10.4 % (ref 3.0–12.0)
Neutro Abs: 2.6 10*3/uL (ref 1.4–7.7)
Neutrophils Relative %: 50.7 % (ref 43.0–77.0)
Platelets: 325 10*3/uL (ref 150.0–400.0)
RBC: 4.1 Mil/uL — ABNORMAL LOW (ref 4.22–5.81)
RDW: 13.9 % (ref 11.5–15.5)
WBC: 5 10*3/uL (ref 4.0–10.5)

## 2021-01-17 LAB — COMPREHENSIVE METABOLIC PANEL
ALT: 21 U/L (ref 0–53)
AST: 18 U/L (ref 0–37)
Albumin: 4.6 g/dL (ref 3.5–5.2)
Alkaline Phosphatase: 70 U/L (ref 39–117)
BUN: 14 mg/dL (ref 6–23)
CO2: 26 mEq/L (ref 19–32)
Calcium: 9.4 mg/dL (ref 8.4–10.5)
Chloride: 102 mEq/L (ref 96–112)
Creatinine, Ser: 0.83 mg/dL (ref 0.40–1.50)
GFR: 104.1 mL/min (ref >60.00)
Glucose, Bld: 71 mg/dL (ref 70–99)
Potassium: 4.2 mEq/L (ref 3.5–5.1)
Sodium: 137 mEq/L (ref 135–145)
Total Bilirubin: 0.4 mg/dL (ref 0.2–1.2)
Total Protein: 7.1 g/dL (ref 6.0–8.3)

## 2021-01-17 MED ORDER — ONDANSETRON HCL 4 MG PO TABS: 4.0000 mg | ORAL_TABLET | Freq: Three times a day (TID) | ORAL | 0 refills | Status: AC | PRN

## 2021-01-17 NOTE — Progress Notes (Signed)
Virtual Visit via Video   I connected with Mario Cameron on 01/17/21 at 11:00 AM EDT by a video enabled telemedicine application and verified that I am speaking with the correct person using two identifiers. Location patient: Home Location provider: [employer], Office Persons participating in the virtual visit: Mario Cameron, Jarold Motto PA-C, Corky Mull, LPN   I discussed the limitations of evaluation and management by telemedicine and the availability of in person appointments. The patient expressed understanding and agreed to proceed.  I acted as a Neurosurgeon for [employer], [employer], LPN   Subjective:   HPI:   Diarrhea Pt c/o diarrhea x 6 days. He reports that he is having abdominal cramping and watery, explosive stools. Has taken Imodium for the past 3 days every 12 hours without relief. He has also tried probiotics, fiber one products, peanut butter, crackers, limited his sugar, cut out dairy products, eating oatmeal made with water. He is s/p bariatric surgery in 2015.  Felt like probiotic and benefiber made symptoms worse. He is pushing fluids and feels like he was able to stay well-hydrated.  No recent travel or suspicious food intake. Took home COVID test that was negative.   Denies: lightheadedness, dizziness, vomiting, LE swelling, rectal bleeding, recent abx intake  ROS: See pertinent positives and negatives per HPI.  Patient Active Problem List   Diagnosis Date Noted  . 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 Suicide attempt (HCC)   . Acute encephalopathy 05/05/2019  . Unspecified atrial flutter (HCC) 04/29/2019  . Atrial flutter with rapid ventricular response (HCC) 04/17/2019  . Atrial fibrillation, rapid (HCC) 04/17/2019  . Borderline personality disorder (HCC) 01/15/2019  . 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 Dysuria 01/11/2019  . 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  Depression, major, single episode, moderate (HCC) 11/22/2018  . Anxiety 11/22/2018  . Bipolar 2 disorder (HCC) 11/22/2018  . 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 Bariatric surgery status 10/14/2014    Social History   Tobacco Use  . Smoking status: Never Smoker  . Smokeless tobacco: Never Used  Substance Use Topics  . Alcohol use: Not Currently    Current Outpatient Medications:  .  B Complex Vitamins (B COMPLEX PO), Take 1 tablet by mouth daily. 10000 IU, Disp: , Rfl:  .  busPIRone (BUSPAR) 30 MG tablet, Take 1 tablet (30 mg total) by mouth 3 (three) times daily., Disp: 60 tablet, Rfl: 2 .  flecainide (TAMBOCOR) 100 MG tablet, Take 1 tablet (100 mg total) by mouth 3 (three) times daily as needed., Disp: 270 tablet, Rfl: 0 .  hydrOXYzine (ATARAX/VISTARIL) 25 MG tablet, Take 1 tablet (25 mg total) by mouth 3 (three) times daily., Disp: 90 tablet, Rfl: 2 .  IRON PO, Take 2 tablets by mouth daily. , Disp: , Rfl:  .  lamoTRIgine (LAMICTAL) 100 MG tablet, Take 1 tablet (100 mg total) by mouth 2 (two) times daily., Disp: 60 tablet, Rfl: 2 .  omeprazole (PRILOSEC) 20 MG capsule, TAKE ONE CAPSULE BY MOUTH TWICE A DAY BEFORE A MEAL, Disp: 60 capsule, Rfl: 1 .  ondansetron (ZOFRAN) 4 MG tablet, Take 1 tablet (4 mg total) by mouth every 8 (eight) hours as needed for nausea or vomiting., Disp: 20 tablet, Rfl: 0 .  Potassium 75 MG TABS, Take 2 tablets by mouth daily. , Disp: , Rfl:  .  prazosin (MINIPRESS) 5 MG capsule, Take  1 capsule (5 mg total) by mouth at bedtime., Disp: 30 capsule, Rfl: 2 .  Probiotic Product (PROBIOTIC DAILY PO), Take 1 capsule by mouth daily in the afternoon., Disp: , Rfl:  .  tretinoin (RETIN-A) 0.1 % cream, Apply topically at bedtime. Apply to face, Disp: , Rfl:  .  valACYclovir (VALTREX) 1000 MG tablet, TAKE 2 TABLETS BY MOUTH EVERY 12 HOURS FOR 1 DAY. START ASAP AFTER SYMPTOMS BEGIN, Disp: 6 tablet, Rfl: 2 .   venlafaxine XR (EFFEXOR-XR) 150 MG 24 hr capsule, Take 2 capsules (300 mg total) by mouth daily with breakfast., Disp: 60 capsule, Rfl: 2 .  VITAMIN A PO, Take 1 capsule by mouth daily in the afternoon., Disp: , Rfl:  .  VITAMIN D PO, Take 5,000 tablets by mouth daily. Pt takes 10,000 IU daily, Disp: , Rfl:  .  zolpidem (AMBIEN) 10 MG tablet, TAKE ONE TABLET BY MOUTH EVERY NIGHT AT BEDTIME AS NEEDED FOR SLEEP, Disp: 30 tablet, Rfl: 2 .  diltiazem (CARDIZEM CD) 180 MG 24 hr capsule, Take 1 capsule (180 mg total) by mouth 2 (two) times daily., Disp: 60 capsule, Rfl: 11  Allergies  Allergen Reactions  . Other Itching  . Atorvastatin Other (See Comments)    myalgias  . Ibuprofen Other (See Comments)    bleeding  . Nsaids Other (See Comments)    bleeding  . Trazodone Hcl Other (See Comments)    Rapid HR  . Wool Alcohol [Lanolin] Itching    sherpa wool    Objective:   VITALS: Per patient if applicable, see vitals. GENERAL: Alert, appears well and in no acute distress. HEENT: Atraumatic, conjunctiva clear, no obvious abnormalities on inspection of external nose and ears. NECK: Normal movements of the head and neck. CARDIOPULMONARY: No increased WOB. Speaking in clear sentences. I:E ratio WNL.  MS: Moves all visible extremities without noticeable abnormality. PSYCH: Pleasant and cooperative, well-groomed. Speech normal rate and rhythm. Affect is appropriate. Insight and judgement are appropriate. Attention is focused, linear, and appropriate.  NEURO: CN grossly intact. Oriented as arrived to appointment on time with no prompting. Moves both UE equally.  SKIN: No obvious lesions, wounds, erythema, or cyanosis noted on face or hands.  Assessment and Plan:   Mario was seen today for diarrhea.  Diagnoses and all orders for this visit:  Diarrhea of presumed infectious origin No red flags on discussion. Will update blood work, have patient complete stool studies, and COVID PCR testing.   Trial zofran to see if this helps with nausea and also to slow down stools. If lack of ability to take po's, recommend urgent care for IVF. Will also assess hydration and lytes status with blood work today. Worsening precautions advised. -     CBC with Differential/Platelet -     Comprehensive metabolic panel -     GI Profile, Stool, PCR -     Ova and parasite examination -     Novel Coronavirus, NAA ([facility])  Other orders -     ondansetron (ZOFRAN) 4 MG tablet; Take 1 tablet (4 mg total) by mouth every 8 (eight) hours as needed for nausea or vomiting.    I discussed the assessment and treatment plan with the patient. The patient was provided an opportunity to ask questions and all were answered. The patient agreed with the plan and demonstrated an understanding of the instructions.   The patient was advised to call back or seek an in-person evaluation if the symptoms worsen or if  the condition fails to improve as anticipated.   CMA or LPN served as scribe during this visit. History, Physical, and Plan performed by medical provider. The above documentation has been reviewed and is accurate and complete.   [place], [place] 01/17/2021

## 2021-01-18 LAB — SARS-COV-2, NAA 2 DAY TAT

## 2021-01-18 LAB — NOVEL CORONAVIRUS, NAA: SARS-CoV-2, NAA: NOT DETECTED

## 2021-02-08 ENCOUNTER — Other Ambulatory Visit: Payer: Self-pay

## 2021-02-08 ENCOUNTER — Ambulatory Visit (INDEPENDENT_AMBULATORY_CARE_PROVIDER_SITE_OTHER): Payer: No Payment, Other | Admitting: Licensed Clinical Social Worker

## 2021-02-08 DIAGNOSIS — F331 Major depressive disorder, recurrent, moderate: Secondary | ICD-10-CM | POA: Diagnosis not present

## 2021-02-11 NOTE — Progress Notes (Signed)
THERAPIST PROGRESS NOTE   Virtual Visit via Video Note  I connected with Mario Cameron on 02/08/21 at  3:00 PM EDT by a video enabled telemedicine application and verified that I am speaking with the correct person using two identifiers.  Location: Patient: Home Provider: [facility]   I discussed the limitations of evaluation and management by telemedicine and the availability of in person appointments. The patient expressed understanding and agreed to proceed. I discussed the assessment and treatment plan with the patient. The patient was provided an opportunity to ask questions and all were answered. The patient agreed with the plan and demonstrated an understanding of the instructions.   I provided 55 minutes of non-face-to-face time during this encounter.  Participation Level: Active  Behavioral Response: CasualAlertDepressed  Type of Therapy: Individual Therapy  Treatment Goals addressed: Communication: Depression/coping  Interventions: Supportive  Summary: Mario Cameron is a 50 y.o. male who presents with hx of depression. Pt last seen 09/13/20. Pt signs on for video session. Gap in care addressed by LCSW. 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 He provides many details of this trial at [facility] and how grateful he is to be able to participate. It is a nonplacebo trial with 16 injections in his R eye. He states he had the last injection 2 wks ago and will be going back for final check up. 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 He also reports more issues with his "good eye" r/t floaters, retina staying attached in this eye thus far. Pt endorses some hope. He shares frustration with [facility] support expected beyond financial services. He explains lack of f/u after multiple calls. LCSW assisted pt to problem solve. Pt will  ask for supervisor. LCSW assessed for status of meds. Pt advises he is taking current regimen as prescribed and feels MH meds are helping him. He reports he has been able to return to work and provides details. 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 Pt states he is not sure how much longer he will be with this client, Rosanne Ashing, he has been with almost 2 yrs. This is a private arrangement and his main income. He states he did finally get rent assistance from [employer] who paid 3 mon back and 3 forward just as pt expecting to be evicted. He is trying to save as much as possible right now since he will have a decline in income when George dies. LCSW assessed for status of father's health after his CABG. Pt states "He is doing remarkable". He advises father is better than ever and no evidence of anx/dep now. Father is currently aware of pt's sight/health issues so they are able to talk openly, a support pt was missing. Pt has returned to group he was in at [facility]. He states their is a new group facilitator and it is "not good". He advises many have left the group and he is not sure what he will do. Pt states he is pleased to be reconnected with this LCSW. LCSW reviewed poc including intro to self talk and wanting to address recurrent depression he expects in the summer since summer approaching. Pt open to addressing topic and states he is able to read short info with a magnifying glass. Self talk literature mailed post session.  Suicidal/Homicidal: Nowithout intent/plan  Therapist Response: Pt remains receptive to care.  Plan: Return again in 4 weeks.  Diagnosis: Axis I: MDD, moderate   [place], LCSW 02/11/2021

## 2021-02-13 ENCOUNTER — Other Ambulatory Visit: Payer: Self-pay | Admitting: Cardiology

## 2021-02-13 DIAGNOSIS — I4892 Unspecified atrial flutter: Secondary | ICD-10-CM

## 2021-02-24 ENCOUNTER — Other Ambulatory Visit: Payer: Self-pay | Admitting: Physician Assistant

## 2021-03-06 ENCOUNTER — Encounter: Payer: Self-pay | Admitting: Physician Assistant

## 2021-03-07 ENCOUNTER — Other Ambulatory Visit: Payer: Self-pay

## 2021-03-07 ENCOUNTER — Other Ambulatory Visit (INDEPENDENT_AMBULATORY_CARE_PROVIDER_SITE_OTHER): Payer: Medicaid Other

## 2021-03-07 ENCOUNTER — Other Ambulatory Visit: Payer: Self-pay | Admitting: Physician Assistant

## 2021-03-07 DIAGNOSIS — R197 Diarrhea, unspecified: Secondary | ICD-10-CM

## 2021-03-07 DIAGNOSIS — Z113 Encounter for screening for infections with a predominantly sexual mode of transmission: Secondary | ICD-10-CM

## 2021-03-07 LAB — LIPASE: Lipase: 33 U/L (ref 11.0–59.0)

## 2021-03-07 MED ORDER — VALACYCLOVIR HCL 1 G PO TABS: ORAL_TABLET | ORAL | 2 refills | Status: DC

## 2021-03-07 NOTE — Addendum Note (Signed)
Addended by: Laddie Aquas A on: 03/07/2021 09:40 AM   Modules accepted: Orders

## 2021-03-07 NOTE — Addendum Note (Signed)
Addended by: Laddie Aquas A on: 03/07/2021 09:41 AM   Modules accepted: Orders

## 2021-03-08 ENCOUNTER — Encounter: Payer: Self-pay | Admitting: Family Medicine

## 2021-03-08 ENCOUNTER — Telehealth (INDEPENDENT_AMBULATORY_CARE_PROVIDER_SITE_OTHER): Payer: Medicaid Other | Admitting: Family Medicine

## 2021-03-08 ENCOUNTER — Encounter: Payer: Self-pay | Admitting: Physician Assistant

## 2021-03-08 DIAGNOSIS — J029 Acute pharyngitis, unspecified: Secondary | ICD-10-CM

## 2021-03-08 LAB — POCT RAPID STREP A (OFFICE): Rapid Strep A Screen: NEGATIVE

## 2021-03-08 MED ORDER — IPRATROPIUM BROMIDE 0.06 % NA SOLN: 2.0000 | Freq: Four times a day (QID) | NASAL | 12 refills | Status: AC

## 2021-03-08 MED ORDER — AZITHROMYCIN 250 MG PO TABS: ORAL_TABLET | ORAL | 0 refills | Status: AC

## 2021-03-08 MED ORDER — BENZONATATE 200 MG PO CAPS: 200.0000 mg | ORAL_CAPSULE | Freq: Two times a day (BID) | ORAL | 0 refills | Status: AC | PRN

## 2021-03-08 NOTE — Telephone Encounter (Signed)
Appt scheduled

## 2021-03-08 NOTE — Progress Notes (Signed)
Please inform patient of the following:  Please let patient know strep test negative.  I sent in nasal spray and cough medication.  Would like for him to try this for a few days and then if not improving try the azithromycin that we sent in.

## 2021-03-08 NOTE — Progress Notes (Signed)
   Mario Cameron is a 50 y.o. male who presents today for a virtual office visit.  Assessment/Plan:  New/Acute Problems: Sore Throat Rapid strep negative.  Likely URI or flareup of allergic rhinitis.  We will start Atrovent and Tessalon.  We will send in pocket description of azithromycin.  Encourage good hydration peer discussed reasons return to care.    Subjective:  HPI:  Patient here with upper respiratory symptoms started about 6 days ago.  Sore throat and ear pain are predominant symptoms.  Occasional cough.  No rhinorrhea.  No postnasal drip.  No sick contacts.  No fevers or chills.  No treatments tried.  He has had strep pharyngitis in the past and is worried about recurrence.        Objective/Observations  Physical Exam: Gen: NAD, resting comfortably Pulm: Normal work of breathing Neuro: Grossly normal, moves all extremities Psych: Normal affect and thought content  Virtual Visit via Video   I connected with Mario Cameron on 03/08/21 at  4:00 PM EDT by a video enabled telemedicine application and verified that I am speaking with the correct person using two identifiers. The limitations of evaluation and management by telemedicine and the availability of in person appointments were discussed. The patient expressed understanding and agreed to proceed.   Patient location: Home Provider location: [place] [facility] [employer] Persons participating in the virtual visit: Myself and Patient     Katina Degree. Jimmey Ralph, MD 03/08/2021 1:23 PM

## 2021-03-09 ENCOUNTER — Ambulatory Visit (INDEPENDENT_AMBULATORY_CARE_PROVIDER_SITE_OTHER): Payer: No Payment, Other | Admitting: Licensed Clinical Social Worker

## 2021-03-09 ENCOUNTER — Other Ambulatory Visit: Payer: Self-pay

## 2021-03-09 DIAGNOSIS — F331 Major depressive disorder, recurrent, moderate: Secondary | ICD-10-CM

## 2021-03-12 LAB — GI PROFILE, STOOL, PCR

## 2021-03-12 NOTE — Progress Notes (Signed)
   THERAPIST PROGRESS NOTE  Virtual Visit via Video Note  I connected with Mario Cameron on 03/09/21 at  3:00 PM EDT by a video enabled telemedicine application and verified that I am speaking with the correct person using two identifiers.  Location: Patient: Home Provider: [facility]   I discussed the limitations of evaluation and management by telemedicine and the availability of in person appointments. The patient expressed understanding and agreed to proceed. I discussed the assessment and treatment plan with the patient. The patient was provided an opportunity to ask questions and all were answered. The patient agreed with the plan and demonstrated an understanding of the instructions.  I provided 55 minutes of non-face-to-face time during this encounter.  Participation Level: Active  Behavioral Response: CasualAlertDepressed  Type of Therapy: Individual Therapy  Treatment Goals addressed: Communication: depression/coping  Interventions: CBT and Supportive  Summary: Mario Cameron is a 50 y.o. male who presents with hx of MDD. This date pt signs on for video session per his preference. Pt confirms some feelings of depression. He reports on situation with sight and advises he will have cataract surgery on 5/23 at [facility]. He states he had another "health scare" r/t to abdominal pain. He reports for about 4 days he was "an emotional wreck". He has been to dr and learned yesterday they r/o pancreatitis and HIV. He states that was "a relief". He reports next suspect is gall bladder. He was given antibiotic he is taking. He advises the pain is better the last couple days. Reports his fam is aware and supportive when asked. Assessed for status of work and client, Mario Cameron, on hospice care. Pt continues to work and advises Mario Cameron is still living. He continues to try to save money knowing that will be a big decline in his income when [place] dies. Pt states he had disability psych eval and is scheduled for  medical. He reports he was denied food stamp renewal for income reasons and has appealed. He states he has been waiting to hear about Medicaid 4 mon. He does continue to have stress r/t finances. LCSW assessed for receipt of self talk lit. Pt advises he got lit and has read. He states it was "very helpful". LCSW provided additional education and CBT. He reports it reminded him he had CBT in after his suicide attempt in 2014 and how helpful it was then. He commits to focusing on self talk and will journal. He states he is not actively suicidal but describes passive thoughts. Remainder of session devoted to coping. Pt reports on writing down music lyrics, he states he has started walking outdoors, he has reconnected with an aunt (dad's oldest sister) by phone who is in [place]. Reports they have been "close" and how good it was to talk to her. Discussed using calendar to organize tasks. LCSW provided encouragement and commended all efforts. Pt considering a pedicure for self care which was also encouraged. LCSW reviewed poc including scheduling prior to close of session. Pt states appreciation for care.    Suicidal/Homicidal: Intermittentwithout intent/plan  Therapist Response: Pt remains receptive to care. EBT book.  Plan: Return again in 3 weeks.  Diagnosis: Axis I: MDD, moderate   Sink, LCSW 03/12/2021

## 2021-03-15 LAB — OVA AND PARASITE EXAMINATION
CONCENTRATE RESULT:: NONE SEEN
MICRO NUMBER:: 11837638
SPECIMEN QUALITY:: ADEQUATE
TRICHROME RESULT:: NONE SEEN

## 2021-03-15 LAB — HIV ANTIBODY (ROUTINE TESTING W REFLEX): HIV 1&2 Ab, 4th Generation: NONREACTIVE

## 2021-03-23 ENCOUNTER — Encounter (HOSPITAL_COMMUNITY): Payer: Self-pay | Admitting: Psychiatry

## 2021-03-23 ENCOUNTER — Other Ambulatory Visit: Payer: Self-pay

## 2021-03-23 ENCOUNTER — Telehealth (INDEPENDENT_AMBULATORY_CARE_PROVIDER_SITE_OTHER): Payer: No Payment, Other | Admitting: Psychiatry

## 2021-03-23 DIAGNOSIS — F419 Anxiety disorder, unspecified: Secondary | ICD-10-CM

## 2021-03-23 DIAGNOSIS — F3181 Bipolar II disorder: Secondary | ICD-10-CM | POA: Diagnosis not present

## 2021-03-23 MED ORDER — LAMOTRIGINE 100 MG PO TABS: 100.0000 mg | ORAL_TABLET | Freq: Two times a day (BID) | ORAL | 2 refills | Status: DC

## 2021-03-23 MED ORDER — HYDROXYZINE HCL 25 MG PO TABS: 25.0000 mg | ORAL_TABLET | Freq: Three times a day (TID) | ORAL | 2 refills | Status: DC

## 2021-03-23 MED ORDER — BUSPIRONE HCL 30 MG PO TABS: 30.0000 mg | ORAL_TABLET | Freq: Three times a day (TID) | ORAL | 2 refills | Status: DC

## 2021-03-23 MED ORDER — PRAZOSIN HCL 5 MG PO CAPS: 5.0000 mg | ORAL_CAPSULE | Freq: Every day | ORAL | 2 refills | Status: DC

## 2021-03-23 MED ORDER — ARIPIPRAZOLE 2 MG PO TABS: 2.0000 mg | ORAL_TABLET | Freq: Every day | ORAL | 2 refills | Status: DC

## 2021-03-23 MED ORDER — VENLAFAXINE HCL ER 150 MG PO CP24: 300.0000 mg | ORAL_CAPSULE | Freq: Every day | ORAL | 2 refills | Status: DC

## 2021-03-23 MED ORDER — ZOLPIDEM TARTRATE 10 MG PO TABS: ORAL_TABLET | ORAL | 2 refills | Status: DC
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
## 2021-03-31 ENCOUNTER — Ambulatory Visit (INDEPENDENT_AMBULATORY_CARE_PROVIDER_SITE_OTHER): Payer: No Payment, Other | Admitting: Licensed Clinical Social Worker

## 2021-03-31 DIAGNOSIS — F331 Major depressive disorder, recurrent, moderate: Secondary | ICD-10-CM

## 2021-04-06 ENCOUNTER — Ambulatory Visit (INDEPENDENT_AMBULATORY_CARE_PROVIDER_SITE_OTHER): Payer: No Payment, Other | Admitting: Licensed Clinical Social Worker

## 2021-04-06 ENCOUNTER — Other Ambulatory Visit: Payer: Self-pay

## 2021-04-06 DIAGNOSIS — F331 Major depressive disorder, recurrent, moderate: Secondary | ICD-10-CM

## 2021-04-06 DIAGNOSIS — F321 Major depressive disorder, single episode, moderate: Secondary | ICD-10-CM | POA: Diagnosis not present

## 2021-04-06 NOTE — Progress Notes (Signed)
   THERAPIST PROGRESS NOTE   Virtual Visit via Video Note  I connected with Mario Cameron on 03/31/21 at  3:00 PM EDT by a video enabled telemedicine application and verified that I am speaking with the correct person using two identifiers.  Location: Patient: Home Provider: [facility]   I discussed the limitations of evaluation and management by telemedicine and the availability of in person appointments. The patient expressed understanding and agreed to proceed. I discussed the assessment and treatment plan with the patient. The patient was provided an opportunity to ask questions and all were answered. The patient agreed with the plan and demonstrated an understanding of the instructions.  I provided 45 minutes of non-face-to-face time during this encounter.  Participation Level: Active  Behavioral Response: Casual and Well GroomedAlertDepressed  Type of Therapy: Individual Therapy  Treatment Goals addressed: Communication: depression/coping  Interventions: CBT and Supportive  Summary: Mario Cameron is a 50 y.o. male who presents with hx of MDD. This date pt returns for video session. LCSW assessed for status of cataract surgery. Pt informs it was cancelled the day before as Division of Blind Ser did not approve payment. Pt provides details. He appealed and it is now approved but not yet rescheduled. Pt frustrated and goes on to report multiple other stressors/frustrations r/t seeking services. States he did get his food stamps reinstated. Pt advises on status of disability claim. He provides updates on feelings of depression and poor sleep. Pt is taking meds as prescribed and just started Abilify 5/19, no change noted as yet. Pt states his abdominal pain has subsided, all tests normal. Pt continues to work. Client in hospice care still living. Pt remains finanacially strained but is managing. Reports he will need to start paying rent again in July. Remiander of session spent addressing  coping. Pt states his cat, Spoofy, is one of the biggest things that "gets me through". Pt did start to use calendar and found a daily worksheet he liked but his printer then broke and he is out of them. LCSW mailed some for him until printer can be repaired. Worksheet included something he is grateful for. Discussed benefits of gratefulness. Pt found booklet re DBT but has not read it yet and is not sure he will as he is fearful it may trigger increased dep since notes in it done right after suicide attempt. Pt got pedicure he was planning on and speaks of how much he enjoyed experience. Staying mindful of self talk. LCSW commended pt for follow through. Reviewed poc including scheduling. Pt states appreciation for care.  Suicidal/Homicidal: Intermittentwithout intent/plan  Therapist Response: Pt remains receptive to care.  Plan: Return again in 1 weeks.  Diagnosis: Axis I: MDD, moderate  [place] Sink, LCSW 04/06/2021

## 2021-04-14 NOTE — Progress Notes (Signed)
   THERAPIST PROGRESS NOTE   Virtual Visit via Video Note  I connected with Mario Cameron on 04/06/21 at  3:00 PM EDT by a video enabled telemedicine application and verified that I am speaking with the correct person using two identifiers.  Location: Patient: Home Provider: [facility]   I discussed the limitations of evaluation and management by telemedicine and the availability of in person appointments. The patient expressed understanding and agreed to proceed. I discussed the assessment and treatment plan with the patient. The patient was provided an opportunity to ask questions and all were answered. The patient agreed with the plan and demonstrated an understanding of the instructions.   I provided 55 minutes of non-face-to-face time during this encounter.  Participation Level: Active  Behavioral Response: CasualAlertAnxious and Depressed  Type of Therapy: Individual Therapy  Treatment Goals addressed: Communication: depression/anxiety/coping  Interventions: CBT and Supportive  Summary: Mario Cameron is a 50 y.o. male who presents with hx of MDD. This date pt signs on for video session per his preference. Pt states his mood is "a mixed bag". Pt reports he thought his rent was covered until July but he needs to start paying rent again as of June. Pt states he does not have the money for rent and will have to ask his parents for assistance. He also states he has received a notice his rent will be increasing 30% which will be ~ $300. He reports his client in hospice care has gone in for a respite stay so he will not have that income for an unknown period of time. He is hoping to get help with utilities. He reports he does not have money on his EBT card as he thought he would. He is trying to f/u with worker who told him he is reinstated in program. Pt states he has food when asked. Pt provides another update on his disability claim he feels he will be approved for. Has legal assist from  [name]. His cataract surgery has not been rescheduled. Pt states the one yr [name] of his loss of sight is July 9, the day before his birthday. He reflects on both. Pt states he is staying mindful of self talk to assist with coping. He reports he has been going to the pool at his complex and recently got to see some friends who came into town. LCSW provided encouragement and emotional care. Pt states there is a new group leader at [facility] group and he is planning to log in for group today to see if this is a resource he may again find useful. LCSW reviewed poc including scheduling. Pt states appreciation for care.  Suicidal/Homicidal: Nowithout intent/plan  Therapist Response: Pt remains receptive to care.  Plan: Return again in 4 weeks.  Diagnosis: Axis I:  MDD, moderate   [place] Sink, LCSW 04/14/2021

## 2021-04-29 ENCOUNTER — Ambulatory Visit (HOSPITAL_COMMUNITY): Payer: No Payment, Other | Admitting: Licensed Clinical Social Worker

## 2021-05-01 ENCOUNTER — Other Ambulatory Visit: Payer: Self-pay | Admitting: Physician Assistant

## 2021-05-06 ENCOUNTER — Ambulatory Visit (HOSPITAL_COMMUNITY): Payer: No Payment, Other | Admitting: Licensed Clinical Social Worker

## 2021-05-20 ENCOUNTER — Other Ambulatory Visit: Payer: Self-pay | Admitting: Physician Assistant

## 2021-05-23 ENCOUNTER — Other Ambulatory Visit: Payer: Self-pay | Admitting: Cardiology

## 2021-05-23 DIAGNOSIS — I4892 Unspecified atrial flutter: Secondary | ICD-10-CM

## 2021-05-25 ENCOUNTER — Other Ambulatory Visit: Payer: Self-pay

## 2021-05-25 ENCOUNTER — Telehealth (INDEPENDENT_AMBULATORY_CARE_PROVIDER_SITE_OTHER): Payer: No Payment, Other | Admitting: Psychiatry

## 2021-05-25 ENCOUNTER — Encounter (HOSPITAL_COMMUNITY): Payer: Self-pay | Admitting: Psychiatry

## 2021-05-25 DIAGNOSIS — F3181 Bipolar II disorder: Secondary | ICD-10-CM | POA: Diagnosis not present

## 2021-05-25 DIAGNOSIS — F419 Anxiety disorder, unspecified: Secondary | ICD-10-CM

## 2021-05-25 MED ORDER — HYDROXYZINE HCL 50 MG PO TABS
50.0000 mg | ORAL_TABLET | Freq: Three times a day (TID) | ORAL | 3 refills | Status: DC
  Filled 2021-05-25: qty 90, 30d supply, fill #0
  Filled 2021-06-22: qty 90, 30d supply, fill #1

## 2021-05-25 MED ORDER — LAMOTRIGINE 100 MG PO TABS
100.0000 mg | ORAL_TABLET | Freq: Two times a day (BID) | ORAL | 2 refills | Status: DC
  Filled 2021-05-25: qty 60, 30d supply, fill #0
  Filled 2021-06-26: qty 60, 30d supply, fill #1

## 2021-05-25 MED ORDER — ARIPIPRAZOLE 5 MG PO TABS
5.0000 mg | ORAL_TABLET | Freq: Every day | ORAL | 3 refills | Status: DC
  Filled 2021-05-25: qty 30, 30d supply, fill #0
  Filled 2021-07-31: qty 30, 30d supply, fill #1

## 2021-05-25 MED ORDER — ZOLPIDEM TARTRATE 10 MG PO TABS: ORAL_TABLET | ORAL | 3 refills | Status: DC

## 2021-05-25 MED ORDER — VENLAFAXINE HCL ER 150 MG PO CP24
300.0000 mg | ORAL_CAPSULE | Freq: Every day | ORAL | 3 refills | Status: DC
Start: 2021-05-25 — End: 2021-07-07
  Filled 2021-05-25: qty 60, 30d supply, fill #0
  Filled 2021-06-26: qty 60, 30d supply, fill #1

## 2021-05-25 MED ORDER — PRAZOSIN HCL 5 MG PO CAPS
5.0000 mg | ORAL_CAPSULE | Freq: Every day | ORAL | 3 refills | Status: DC
  Filled 2021-05-25: qty 30, 30d supply, fill #0
  Filled 2021-06-26 – 2021-06-27 (×2): qty 30, 30d supply, fill #1

## 2021-05-25 MED ORDER — BUSPIRONE HCL 30 MG PO TABS
30.0000 mg | ORAL_TABLET | Freq: Three times a day (TID) | ORAL | 3 refills | Status: DC
  Filled 2021-05-25: qty 60, 20d supply, fill #0
  Filled 2021-06-26: qty 60, 20d supply, fill #1
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
## 2021-05-26 ENCOUNTER — Other Ambulatory Visit: Payer: Self-pay

## 2021-05-30 ENCOUNTER — Other Ambulatory Visit: Payer: Self-pay

## 2021-06-02 ENCOUNTER — Other Ambulatory Visit: Payer: Self-pay

## 2021-06-06 ENCOUNTER — Encounter: Payer: Self-pay | Admitting: Physician Assistant

## 2021-06-09 ENCOUNTER — Ambulatory Visit (INDEPENDENT_AMBULATORY_CARE_PROVIDER_SITE_OTHER): Payer: No Payment, Other | Admitting: Licensed Clinical Social Worker

## 2021-06-09 ENCOUNTER — Other Ambulatory Visit: Payer: Self-pay

## 2021-06-09 DIAGNOSIS — F331 Major depressive disorder, recurrent, moderate: Secondary | ICD-10-CM
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
## 2021-06-23 ENCOUNTER — Other Ambulatory Visit: Payer: Self-pay

## 2021-06-26 ENCOUNTER — Encounter: Payer: Self-pay | Admitting: Physician Assistant

## 2021-06-27 ENCOUNTER — Other Ambulatory Visit: Payer: Self-pay

## 2021-06-27 MED ORDER — OMEPRAZOLE 20 MG PO CPDR
DELAYED_RELEASE_CAPSULE | ORAL | 2 refills | Status: AC
  Filled 2021-06-27 – 2021-07-04 (×2): qty 60, 30d supply, fill #0
  Filled 2021-08-11: qty 60, 30d supply, fill #1
  Filled 2021-09-06: qty 60, 30d supply, fill #2

## 2021-06-27 MED ORDER — VALACYCLOVIR HCL 1 G PO TABS
ORAL_TABLET | ORAL | 2 refills | Status: AC
  Filled 2021-06-27 – 2021-07-04 (×2): qty 6, 3d supply, fill #0
  Filled 2021-07-31: qty 6, 3d supply, fill #1
  Filled 2021-09-06: qty 6, 3d supply, fill #2

## 2021-06-28 ENCOUNTER — Other Ambulatory Visit: Payer: Self-pay

## 2021-06-30 ENCOUNTER — Ambulatory Visit (INDEPENDENT_AMBULATORY_CARE_PROVIDER_SITE_OTHER): Payer: No Payment, Other | Admitting: Licensed Clinical Social Worker

## 2021-06-30 DIAGNOSIS — F331 Major depressive disorder, recurrent, moderate: Secondary | ICD-10-CM

## 2021-07-04 ENCOUNTER — Other Ambulatory Visit: Payer: Self-pay

## 2021-07-04 DIAGNOSIS — I4892 Unspecified atrial flutter: Secondary | ICD-10-CM

## 2021-07-04 MED ORDER — DILTIAZEM HCL ER COATED BEADS 180 MG PO CP24
180.0000 mg | ORAL_CAPSULE | Freq: Two times a day (BID) | ORAL | 1 refills | Status: AC
Start: 2021-07-04 — End: ?
  Filled 2021-07-04: qty 60, 30d supply, fill #0
  Filled 2021-08-16: qty 60, 30d supply, fill #1

## 2021-07-04 NOTE — Progress Notes (Signed)
   THERAPIST PROGRESS NOTE   Virtual Visit via Video Note  I connected with Mario Cameron on 06/30/21 at  1:00 PM EDT by a video enabled telemedicine application and verified that I am speaking with the correct person using two identifiers.  Location: Patient: Home Provider: [facility]   I discussed the limitations of evaluation and management by telemedicine and the availability of in person appointments. The patient expressed understanding and agreed to proceed. I discussed the assessment and treatment plan with the patient. The patient was provided an opportunity to ask questions and all were answered. The patient agreed with the plan and demonstrated an understanding of the instructions.  I provided 45 minutes of non-face-to-face time during this encounter.  Participation Level: Active  Behavioral Response: Casual and Well GroomedLethargicDepressed  Type of Therapy: Individual Therapy  Treatment Goals addressed: Communication: depression/coping  Interventions: CBT, Solution Focused, Supportive, and Reframing  Summary: Mario Cameron is a 50 y.o. male who presents with hx MDD. This date pt signs on for video session per schedule. He presents in a low, lethargic state. Pt endorses ongoing S&S of the seasonal depression he feels this time of year. He states he is sleeping too much. He continues to be able to work but has just taken a "couple of mental health days". 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 He reports the emotions of client's wife are hard to witness. Discussed and validated compassion fatigue. Pt has ongoing anx and worry r/t finances. Pt advises he has been incorrectly billed for eye care rather than [facility]. He states he keeps trying to straighten it out and has been turned over to collections. Pt reports he has a disconnect notice from [employer]. LCSW advised of Holiday representative, [employer]. Pt states he went, became  so frustrated he walked out. He provides details and refuses to ever try them again. Parents are continuing to help him financially and he is using some credit prn. Pt also reports anx awaiting disability decision with continually checking for results. Despite circumstances pt still intends to go on trip to [facility] Sep 7-21. Pt advises both parents have just had COVID but they should be clear by the time of his trip. Assessment of meds reveals pt stopped his Abilify 2 wks ago. He reports he felt it was increasing his anx. He is taking other meds as prescribed. Pt continues the online [place] group but did not get started with the in person group as yet. LCSW reviewed coping strategies. Provided info on 988 pt had not yet heard about for suicide prevention. LCSW reviewed poc including scheduling prior to close of session. Pt states appreciation for care.    Suicidal/Homicidal:  Intermittentwithout intent/plan  Therapist Response: Pt remains receptive to care.  Plan: Return again in 4 weeks.  Diagnosis: Axis I:  MDD, moderate   Nora Sink, LCSW 07/04/2021

## 2021-07-05 ENCOUNTER — Other Ambulatory Visit: Payer: Self-pay

## 2021-07-07 ENCOUNTER — Telehealth (HOSPITAL_COMMUNITY): Payer: Self-pay | Admitting: Psychiatry

## 2021-07-07 ENCOUNTER — Other Ambulatory Visit: Payer: Self-pay

## 2021-07-07 ENCOUNTER — Other Ambulatory Visit (HOSPITAL_COMMUNITY): Payer: Self-pay | Admitting: Psychiatry

## 2021-07-07 DIAGNOSIS — F3181 Bipolar II disorder: Secondary | ICD-10-CM

## 2021-07-07 DIAGNOSIS — F419 Anxiety disorder, unspecified: Secondary | ICD-10-CM

## 2021-07-07 MED ORDER — LAMOTRIGINE 100 MG PO TABS
100.0000 mg | ORAL_TABLET | Freq: Two times a day (BID) | ORAL | 2 refills | Status: DC
  Filled 2021-07-07 – 2021-07-31 (×2): qty 60, 30d supply, fill #0

## 2021-07-07 MED ORDER — HYDROXYZINE HCL 50 MG PO TABS
50.0000 mg | ORAL_TABLET | Freq: Three times a day (TID) | ORAL | 3 refills | Status: DC
  Filled 2021-07-07 – 2021-07-31 (×2): qty 90, 30d supply, fill #0

## 2021-07-07 MED ORDER — BUSPIRONE HCL 30 MG PO TABS
30.0000 mg | ORAL_TABLET | Freq: Three times a day (TID) | ORAL | 3 refills | Status: DC
  Filled 2021-07-07: qty 60, 20d supply, fill #0

## 2021-07-07 MED ORDER — ZOLPIDEM TARTRATE 10 MG PO TABS: ORAL_TABLET | ORAL | 3 refills | Status: DC

## 2021-07-07 MED ORDER — VENLAFAXINE HCL ER 150 MG PO CP24
300.0000 mg | ORAL_CAPSULE | Freq: Every day | ORAL | 3 refills | Status: DC
Start: 2021-07-07 — End: 2021-08-31
  Filled 2021-07-07 – 2021-07-31 (×2): qty 60, 30d supply, fill #0

## 2021-07-07 MED ORDER — PRAZOSIN HCL 5 MG PO CAPS
5.0000 mg | ORAL_CAPSULE | Freq: Every day | ORAL | 3 refills | Status: DC
  Filled 2021-07-07: qty 30, 30d supply, fill #0
  Filled 2021-08-16: qty 30, 30d supply, fill #1

## 2021-07-07 NOTE — Telephone Encounter (Signed)
Patient is calling stating that he will be traveling to [place] on 9/6 and his medication refill is set to be available after that date, but he does not want to stop his meds. Would like to know if there is a way that he can receive his meds while on vacation.

## 2021-08-01 ENCOUNTER — Other Ambulatory Visit: Payer: Self-pay

## 2021-08-01 ENCOUNTER — Ambulatory Visit (HOSPITAL_COMMUNITY): Payer: No Payment, Other | Admitting: Licensed Clinical Social Worker

## 2021-08-03 ENCOUNTER — Other Ambulatory Visit: Payer: Self-pay

## 2021-08-08 ENCOUNTER — Telehealth (HOSPITAL_COMMUNITY): Payer: Self-pay | Admitting: Psychiatry

## 2021-08-08 NOTE — Telephone Encounter (Signed)
Patient calling for 2 reasons:  Requesting animal companion letter for cat. Has one from previous provider. Pt is [place] a [place] apartment [place] of state in December or January, but apartments need the letter in the application. Verbiage to cope for stressors and minimizing psychiatric symptoms to maintain psychiatric stability.  2. Also, [place] transferred Ambien to [place] but [place] said they are invalid now so pt needs a new prescription.

## 2021-08-09 ENCOUNTER — Encounter (HOSPITAL_COMMUNITY): Payer: Self-pay | Admitting: Psychiatry

## 2021-08-09 NOTE — Telephone Encounter (Signed)
Provider attempted to call patient without success.  Provider left patient an email informing him that a letter will be written in regards to his support animal.  Provider however noted that Ambien could not be filled as provider is not licensed in [place].  Provider informed patient to call clinic with any concerns.

## 2021-08-11 ENCOUNTER — Telehealth (HOSPITAL_COMMUNITY): Payer: Self-pay | Admitting: *Deleted

## 2021-08-11 ENCOUNTER — Other Ambulatory Visit: Payer: Self-pay

## 2021-08-11 NOTE — Telephone Encounter (Signed)
VM from [phone] asking for [place] to confirm we were the writer of the letter for him in support of him having a companion animal for his mental health. Left them a VM in return verifying we were in fact the agency that wrote the letter, and the author of the letter was Toy Cookey DNP.

## 2021-08-15 ENCOUNTER — Other Ambulatory Visit: Payer: Self-pay

## 2021-08-17 ENCOUNTER — Other Ambulatory Visit: Payer: Self-pay

## 2021-08-19 ENCOUNTER — Other Ambulatory Visit: Payer: Self-pay

## 2021-08-31 ENCOUNTER — Encounter (HOSPITAL_COMMUNITY): Payer: Self-pay | Admitting: Psychiatry

## 2021-08-31 ENCOUNTER — Other Ambulatory Visit: Payer: Self-pay

## 2021-08-31 ENCOUNTER — Telehealth (INDEPENDENT_AMBULATORY_CARE_PROVIDER_SITE_OTHER): Payer: No Payment, Other | Admitting: Psychiatry

## 2021-08-31 DIAGNOSIS — F419 Anxiety disorder, unspecified: Secondary | ICD-10-CM | POA: Diagnosis not present

## 2021-08-31 DIAGNOSIS — F3181 Bipolar II disorder: Secondary | ICD-10-CM | POA: Diagnosis not present

## 2021-08-31 MED ORDER — HYDROXYZINE HCL 50 MG PO TABS
50.0000 mg | ORAL_TABLET | Freq: Three times a day (TID) | ORAL | 3 refills | Status: AC
  Filled 2021-08-31: qty 90, 30d supply, fill #0

## 2021-08-31 MED ORDER — PRAZOSIN HCL 5 MG PO CAPS
5.0000 mg | ORAL_CAPSULE | Freq: Every day | ORAL | 3 refills | Status: AC
  Filled 2021-08-31: qty 30, 30d supply, fill #0

## 2021-08-31 MED ORDER — BUSPIRONE HCL 30 MG PO TABS
30.0000 mg | ORAL_TABLET | Freq: Two times a day (BID) | ORAL | 3 refills | Status: AC
  Filled 2021-08-31: qty 60, 30d supply, fill #0

## 2021-08-31 MED ORDER — ZOLPIDEM TARTRATE 10 MG PO TABS: ORAL_TABLET | ORAL | 3 refills | Status: DC

## 2021-08-31 MED ORDER — ARIPIPRAZOLE 5 MG PO TABS
5.0000 mg | ORAL_TABLET | Freq: Every day | ORAL | 3 refills | Status: AC
  Filled 2021-08-31: qty 30, 30d supply, fill #0

## 2021-08-31 MED ORDER — VENLAFAXINE HCL ER 150 MG PO CP24
300.0000 mg | ORAL_CAPSULE | Freq: Every day | ORAL | 3 refills | Status: AC
  Filled 2021-08-31: qty 60, 30d supply, fill #0

## 2021-08-31 MED ORDER — LAMOTRIGINE 100 MG PO TABS
100.0000 mg | ORAL_TABLET | Freq: Two times a day (BID) | ORAL | 3 refills | Status: AC
Start: 2021-08-31 — End: ?
  Filled 2021-08-31: qty 60, 30d supply, fill #0
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
## 2021-09-01 ENCOUNTER — Ambulatory Visit (INDEPENDENT_AMBULATORY_CARE_PROVIDER_SITE_OTHER): Payer: No Payment, Other | Admitting: Licensed Clinical Social Worker

## 2021-09-01 DIAGNOSIS — F331 Major depressive disorder, recurrent, moderate: Secondary | ICD-10-CM

## 2021-09-01 NOTE — Progress Notes (Signed)
   THERAPIST PROGRESS NOTE   Virtual Visit via Video Note  I connected with Mario Cameron on 09/01/21 at  2:00 PM EDT by a video enabled telemedicine application and verified that I am speaking with the correct person using two identifiers.  Location: Patient: Home Provider: [facility]   I discussed the limitations of evaluation and management by telemedicine and the availability of in person appointments. The patient expressed understanding and agreed to proceed.  I discussed the assessment and treatment plan with the patient. The patient was provided an opportunity to ask questions and all were answered. The patient agreed with the plan and demonstrated an understanding of the instructions.   The patient was advised to call back or seek an in-person evaluation if the symptoms worsen or if the condition fails to improve as anticipated.  I provided 48 minutes of non-face-to-face time during this encounter.  Participation Level: Active  Behavioral Response: CasualAlertPositive  Type of Therapy: Individual Therapy  Treatment Goals addressed: Communication: dep/copiing  Interventions: CBT, Solution Focused, Supportive, and Reframing  Summary: Mario Cameron is a 50 y.o. male who presents with hx of MDD. Today pt signs on for session. Last seen 06/30/21. Pt advises he is doing well at this time. LCSW assessed for his trip to [place] and any significant changes. Pt states his trip to parents was "very good". He provides details. Pt then advises he got a call while there r/t his disability and it being approved. He states call came from a SW with [employer] which was confusing for him given he has Therapist, music and would have expected to hear from them or SS directly. Pt reports on details of the extensive follow up he did trying to find out what was happening. He states he ultimately did verify he was approved with retro 2+ yrs. Pt reports he got lump sum and has had one monthly  payment. He further reports he will be moving to [place] in ~ 3 wks. He used to live in this area and will be closer to friends and fam. Pt reports he already has housing as well as other services. He is working on getting MH care in place. Remainder of session spent on his move plan and how he arrived at the decision. Pt reports he is relieved and excited. LCSW spent time facilitating review of the work clinician and client did over the past yr. Encouraged pt to continue with coping strategies learned and good self talk. Mario Cameron confirms and states much appreciation for care provided; "I will miss you".  Suicidal/Homicidal: Nowithout intent/plan  Therapist Response: Pt looking forward to moving and will secure new services in [place].  Plan: Termination: Pt moving to [place].  Diagnosis: Axis I:  MDD, moderate  [place] S[place], LCSW 09/01/2021

## 2021-09-06 ENCOUNTER — Other Ambulatory Visit: Payer: Self-pay

## 2021-09-06 NOTE — Telephone Encounter (Signed)
Pt requesting refill on flecainide. Please advise. Thanks

## 2021-09-07 ENCOUNTER — Other Ambulatory Visit: Payer: Self-pay

## 2021-09-08 NOTE — Telephone Encounter (Signed)
It appears that Dr. Ladona Ridgel filled this, but from my perspective he would need to be seen for me to continue this long term. Thanks.

## 2021-09-15 ENCOUNTER — Other Ambulatory Visit: Payer: Self-pay

## 2021-09-15 MED ORDER — FLECAINIDE ACETATE 100 MG PO TABS
100.0000 mg | ORAL_TABLET | Freq: Three times a day (TID) | ORAL | 0 refills | Status: AC | PRN
  Filled 2021-09-15: qty 45, 15d supply, fill #0

## 2021-09-16 ENCOUNTER — Other Ambulatory Visit: Payer: Self-pay

## 2021-10-07 ENCOUNTER — Other Ambulatory Visit: Payer: Self-pay

## 2021-10-10 ENCOUNTER — Other Ambulatory Visit: Payer: Self-pay

## 2021-11-23 ENCOUNTER — Other Ambulatory Visit (HOSPITAL_COMMUNITY): Payer: Self-pay | Admitting: Psychiatry

## 2021-11-23 DIAGNOSIS — F3181 Bipolar II disorder: Secondary | ICD-10-CM

## 2021-12-27 ENCOUNTER — Telehealth (HOSPITAL_COMMUNITY): Payer: Self-pay | Admitting: *Deleted

## 2021-12-27 NOTE — Telephone Encounter (Signed)
[employer] [place]:  Has Approved venlafaxine XR (EFFEXOR-XR) 150 MG 24 hr capsule  Start: 11/06/2021  End: 12/06/2022

## 2022-01-19 ENCOUNTER — Other Ambulatory Visit (HOSPITAL_COMMUNITY): Payer: Self-pay | Admitting: Psychiatry

## 2022-01-19 DIAGNOSIS — F419 Anxiety disorder, unspecified: Secondary | ICD-10-CM
